# Patient Record
Sex: Female | Born: 1975 | Race: White | Hispanic: No | Marital: Married | Smoking: Never smoker
Health system: Southern US, Community
[De-identification: ages and names within clinical notes are randomized; demographics above are authoritative.]

## PROBLEM LIST (undated history)

## (undated) DIAGNOSIS — D649 Anemia, unspecified: Secondary | ICD-10-CM

## (undated) DIAGNOSIS — T7840XA Allergy, unspecified, initial encounter: Secondary | ICD-10-CM

## (undated) DIAGNOSIS — Z01419 Encounter for gynecological examination (general) (routine) without abnormal findings: Secondary | ICD-10-CM

## (undated) HISTORY — DX: Anemia, unspecified: D64.9

## (undated) HISTORY — DX: Allergy, unspecified, initial encounter: T78.40XA

## (undated) HISTORY — PX: LASER ABLATION OF THE CERVIX: SHX1949

## (undated) HISTORY — PX: MOUTH SURGERY: SHX715

## (undated) HISTORY — DX: Encounter for gynecological examination (general) (routine) without abnormal findings: Z01.419

---

## 1998-10-10 ENCOUNTER — Other Ambulatory Visit: Admission: RE | Admit: 1998-10-10 | Discharge: 1998-10-10 | Payer: Self-pay | Admitting: Obstetrics and Gynecology

## 1999-04-02 ENCOUNTER — Other Ambulatory Visit: Admission: RE | Admit: 1999-04-02 | Discharge: 1999-04-02 | Payer: Self-pay | Admitting: Obstetrics and Gynecology

## 1999-05-07 ENCOUNTER — Other Ambulatory Visit: Admission: RE | Admit: 1999-05-07 | Discharge: 1999-05-07 | Payer: Self-pay | Admitting: *Deleted

## 1999-05-07 ENCOUNTER — Encounter (INDEPENDENT_AMBULATORY_CARE_PROVIDER_SITE_OTHER): Payer: Self-pay | Admitting: Specialist

## 1999-07-24 ENCOUNTER — Encounter (INDEPENDENT_AMBULATORY_CARE_PROVIDER_SITE_OTHER): Payer: Self-pay

## 1999-07-24 ENCOUNTER — Other Ambulatory Visit: Admission: RE | Admit: 1999-07-24 | Discharge: 1999-07-24 | Payer: Self-pay | Admitting: Obstetrics and Gynecology

## 2001-01-21 ENCOUNTER — Other Ambulatory Visit: Admission: RE | Admit: 2001-01-21 | Discharge: 2001-01-21 | Payer: Self-pay | Admitting: *Deleted

## 2002-10-21 ENCOUNTER — Ambulatory Visit (HOSPITAL_COMMUNITY): Admission: RE | Admit: 2002-10-21 | Discharge: 2002-10-21 | Payer: Self-pay | Admitting: Obstetrics and Gynecology

## 2002-10-21 ENCOUNTER — Encounter (INDEPENDENT_AMBULATORY_CARE_PROVIDER_SITE_OTHER): Payer: Self-pay

## 2003-01-09 ENCOUNTER — Other Ambulatory Visit: Admission: RE | Admit: 2003-01-09 | Discharge: 2003-01-09 | Payer: Self-pay | Admitting: Obstetrics and Gynecology

## 2003-07-31 ENCOUNTER — Inpatient Hospital Stay (HOSPITAL_COMMUNITY): Admission: AD | Admit: 2003-07-31 | Discharge: 2003-07-31 | Payer: Self-pay | Admitting: Obstetrics and Gynecology

## 2003-08-01 ENCOUNTER — Inpatient Hospital Stay (HOSPITAL_COMMUNITY): Admission: AD | Admit: 2003-08-01 | Discharge: 2003-08-01 | Payer: Self-pay | Admitting: Obstetrics and Gynecology

## 2003-10-06 ENCOUNTER — Inpatient Hospital Stay (HOSPITAL_COMMUNITY): Admission: AD | Admit: 2003-10-06 | Discharge: 2003-10-09 | Payer: Self-pay | Admitting: Obstetrics and Gynecology

## 2005-04-12 ENCOUNTER — Inpatient Hospital Stay (HOSPITAL_COMMUNITY): Admission: AD | Admit: 2005-04-12 | Discharge: 2005-04-12 | Payer: Self-pay | Admitting: Obstetrics and Gynecology

## 2005-05-27 ENCOUNTER — Inpatient Hospital Stay (HOSPITAL_COMMUNITY): Admission: AD | Admit: 2005-05-27 | Discharge: 2005-05-27 | Payer: Self-pay | Admitting: Obstetrics and Gynecology

## 2005-06-03 ENCOUNTER — Inpatient Hospital Stay (HOSPITAL_COMMUNITY): Admission: AD | Admit: 2005-06-03 | Discharge: 2005-06-04 | Payer: Self-pay | Admitting: Obstetrics and Gynecology

## 2005-09-26 ENCOUNTER — Ambulatory Visit: Payer: Self-pay | Admitting: Family Medicine

## 2006-09-13 ENCOUNTER — Emergency Department (HOSPITAL_COMMUNITY): Admission: EM | Admit: 2006-09-13 | Discharge: 2006-09-13 | Payer: Self-pay | Admitting: Emergency Medicine

## 2006-10-27 ENCOUNTER — Ambulatory Visit (HOSPITAL_COMMUNITY): Admission: RE | Admit: 2006-10-27 | Discharge: 2006-10-27 | Payer: Self-pay | Admitting: Obstetrics and Gynecology

## 2006-11-10 ENCOUNTER — Ambulatory Visit (HOSPITAL_COMMUNITY): Admission: RE | Admit: 2006-11-10 | Discharge: 2006-11-10 | Payer: Self-pay | Admitting: Obstetrics and Gynecology

## 2006-11-24 ENCOUNTER — Ambulatory Visit (HOSPITAL_COMMUNITY): Admission: RE | Admit: 2006-11-24 | Discharge: 2006-11-24 | Payer: Self-pay | Admitting: Obstetrics and Gynecology

## 2006-12-08 ENCOUNTER — Ambulatory Visit (HOSPITAL_COMMUNITY): Admission: RE | Admit: 2006-12-08 | Discharge: 2006-12-08 | Payer: Self-pay | Admitting: Obstetrics and Gynecology

## 2006-12-22 ENCOUNTER — Ambulatory Visit (HOSPITAL_COMMUNITY): Admission: RE | Admit: 2006-12-22 | Discharge: 2006-12-22 | Payer: Self-pay | Admitting: Obstetrics and Gynecology

## 2007-01-06 ENCOUNTER — Ambulatory Visit (HOSPITAL_COMMUNITY): Admission: RE | Admit: 2007-01-06 | Discharge: 2007-01-06 | Payer: Self-pay | Admitting: Obstetrics and Gynecology

## 2007-01-21 ENCOUNTER — Ambulatory Visit (HOSPITAL_COMMUNITY): Admission: RE | Admit: 2007-01-21 | Discharge: 2007-01-21 | Payer: Self-pay | Admitting: Obstetrics and Gynecology

## 2007-02-10 ENCOUNTER — Ambulatory Visit (HOSPITAL_COMMUNITY): Admission: RE | Admit: 2007-02-10 | Discharge: 2007-02-10 | Payer: Self-pay | Admitting: Obstetrics and Gynecology

## 2007-02-17 ENCOUNTER — Ambulatory Visit: Payer: Self-pay | Admitting: Family Medicine

## 2007-02-24 ENCOUNTER — Ambulatory Visit (HOSPITAL_COMMUNITY): Admission: RE | Admit: 2007-02-24 | Discharge: 2007-02-24 | Payer: Self-pay | Admitting: Obstetrics and Gynecology

## 2007-03-12 ENCOUNTER — Ambulatory Visit (HOSPITAL_COMMUNITY): Admission: RE | Admit: 2007-03-12 | Discharge: 2007-03-12 | Payer: Self-pay | Admitting: Obstetrics and Gynecology

## 2007-03-24 ENCOUNTER — Inpatient Hospital Stay (HOSPITAL_COMMUNITY): Admission: AD | Admit: 2007-03-24 | Discharge: 2007-03-26 | Payer: Self-pay | Admitting: Obstetrics and Gynecology

## 2007-07-02 ENCOUNTER — Ambulatory Visit: Payer: Self-pay | Admitting: Family Medicine

## 2007-07-02 DIAGNOSIS — J019 Acute sinusitis, unspecified: Secondary | ICD-10-CM | POA: Insufficient documentation

## 2007-09-16 HISTORY — PX: ENDOMETRIAL ABLATION W/ NOVASURE: SUR434

## 2007-11-24 ENCOUNTER — Ambulatory Visit: Payer: Self-pay | Admitting: Family Medicine

## 2007-11-24 DIAGNOSIS — IMO0001 Reserved for inherently not codable concepts without codable children: Secondary | ICD-10-CM | POA: Insufficient documentation

## 2007-12-13 ENCOUNTER — Telehealth: Payer: Self-pay | Admitting: Family Medicine

## 2007-12-17 ENCOUNTER — Encounter: Payer: Self-pay | Admitting: Family Medicine

## 2008-01-08 IMAGING — US US OB MCA DOPPLER
1 series · 14 of 18 positions shown · non-contrast
Comparison: none

OBSTETRICAL ULTRASOUND:
 This ultrasound was performed in The [HOSPITAL], and the [facility] report will be stored to [REDACTED] PACS.

[Series 1: us ob mca doppler · 14 of 18 slices shown]
[im 1/18]
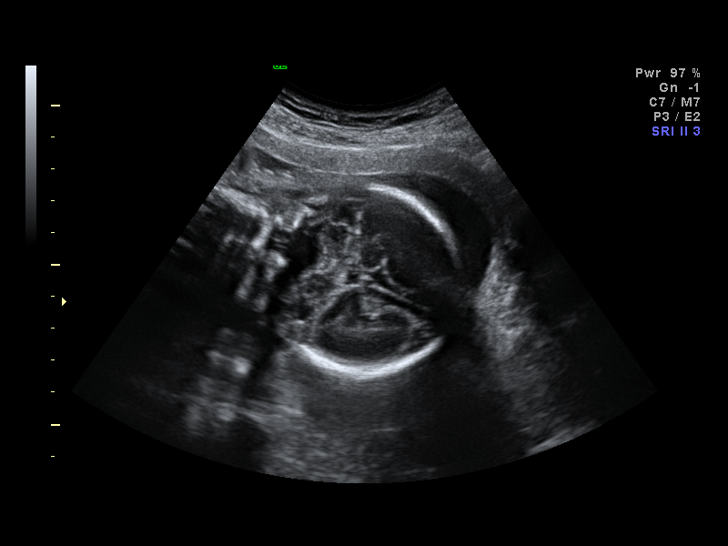
[im 2/18]
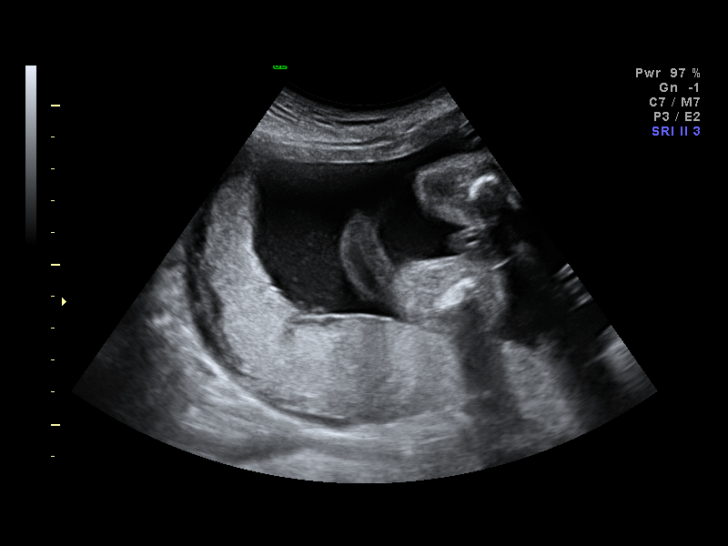
[im 4/18]
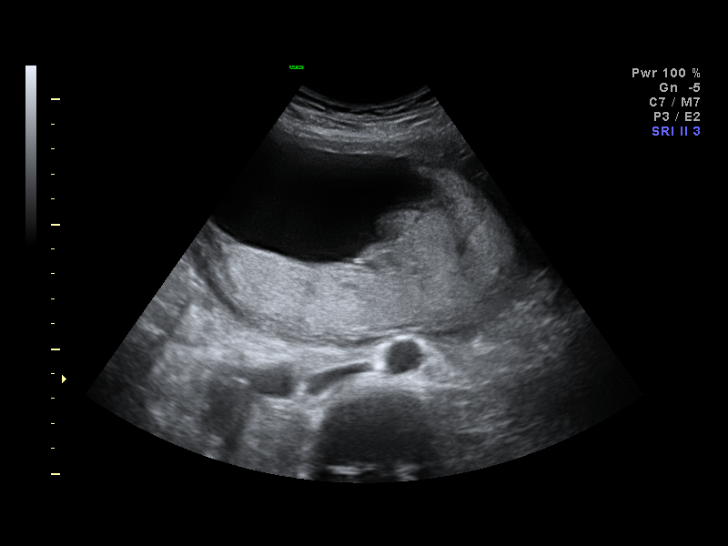
[im 5/18]
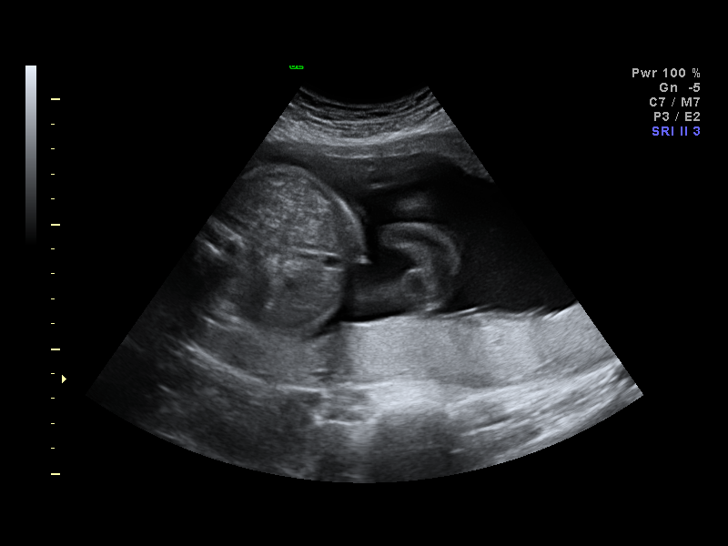
[im 6/18]
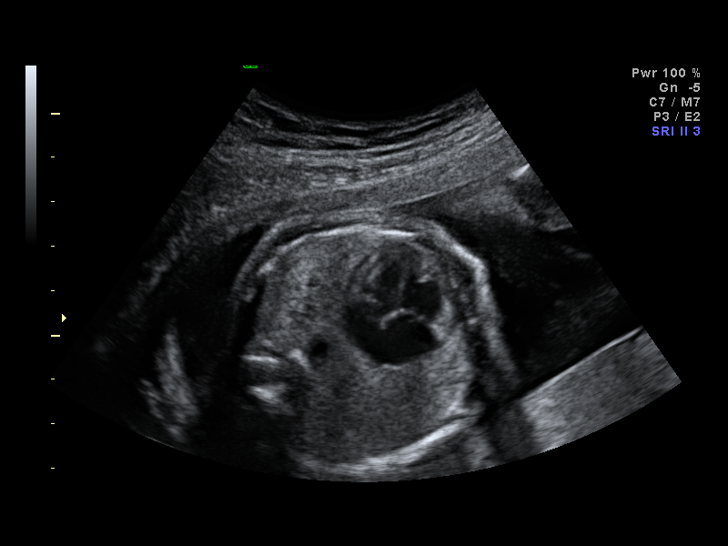
[im 8/18]
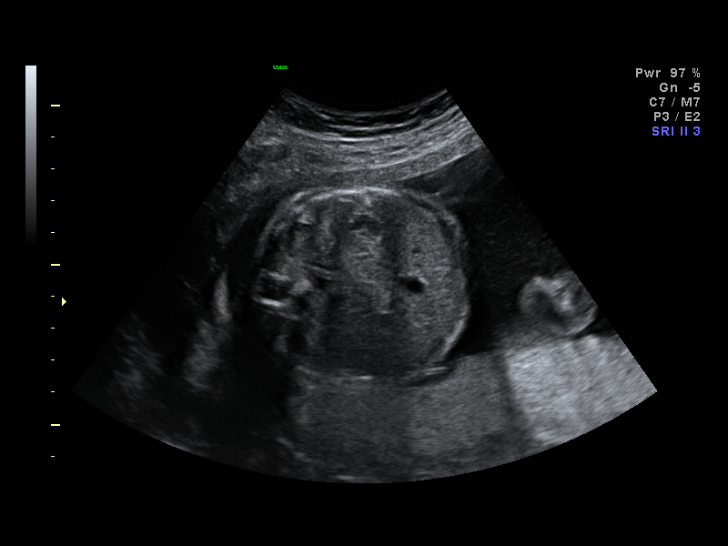
[im 9/18]
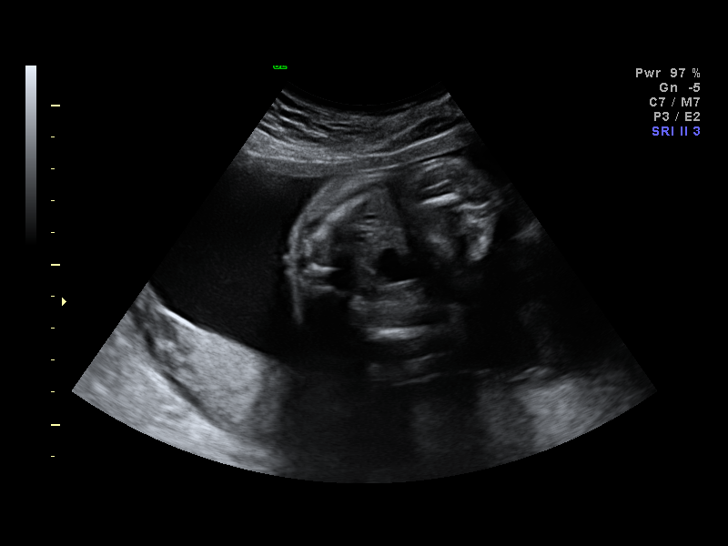
[im 10/18]
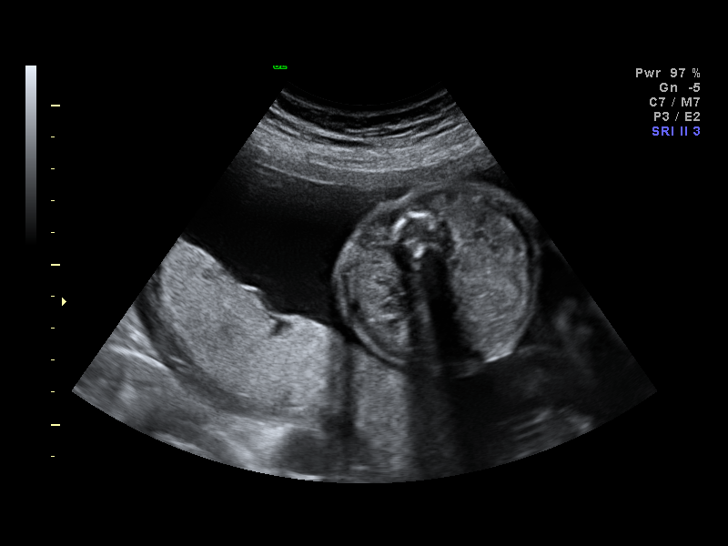
[im 11/18]
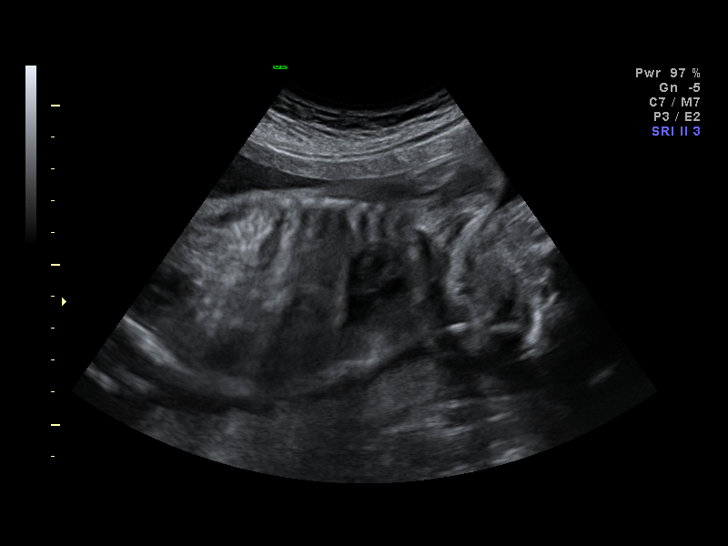
[im 13/18]
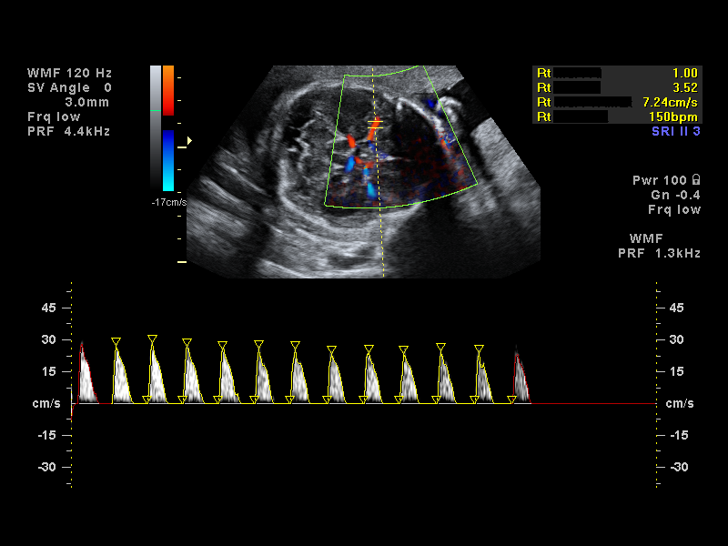
[im 14/18]
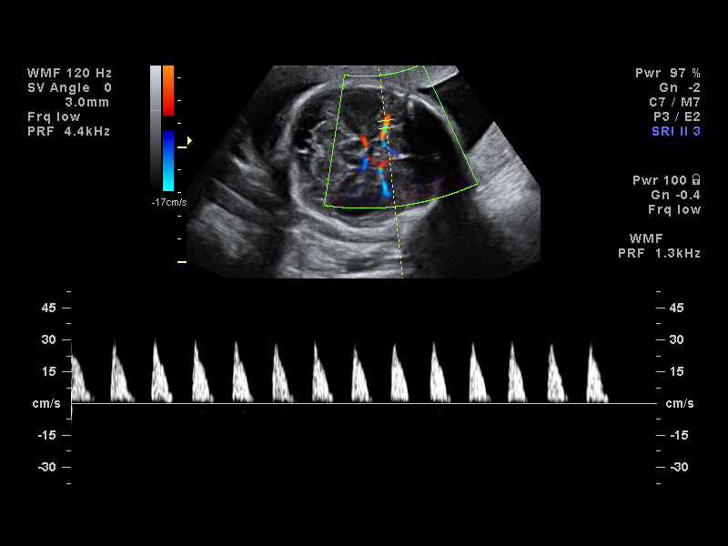
[im 15/18]
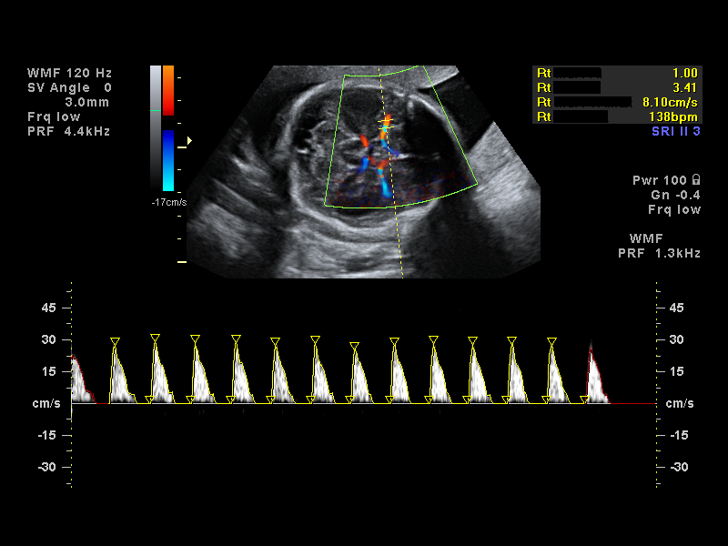
[im 17/18]
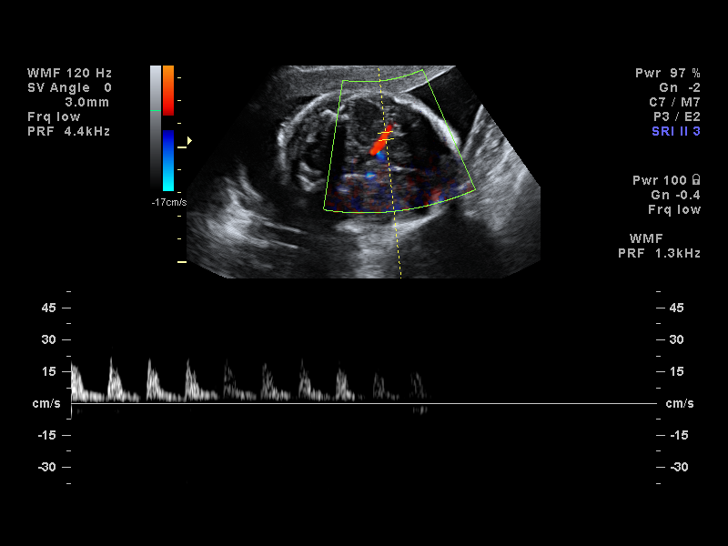
[im 18/18]
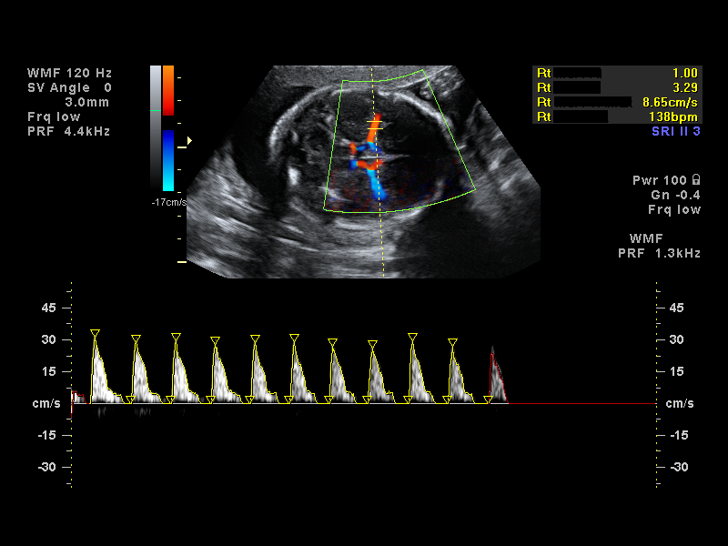

[14 of 18 positions shown; findings below may reference images not displayed]

IMPRESSION: The [facility] report has also been faxed to the ordering physician.

## 2008-01-31 ENCOUNTER — Ambulatory Visit: Payer: Self-pay | Admitting: Family Medicine

## 2008-01-31 DIAGNOSIS — S91309A Unspecified open wound, unspecified foot, initial encounter: Secondary | ICD-10-CM

## 2008-02-07 IMAGING — US US OB FOLLOW-UP
1 series · 14 of 28 positions shown · non-contrast
Comparison: none

OBSTETRICAL ULTRASOUND:
 This ultrasound was performed in The [HOSPITAL], and the [facility] report will be stored to [REDACTED] PACS.

[Series 1: us ob follow-up · 14 of 33 slices shown]
[im 2/33]
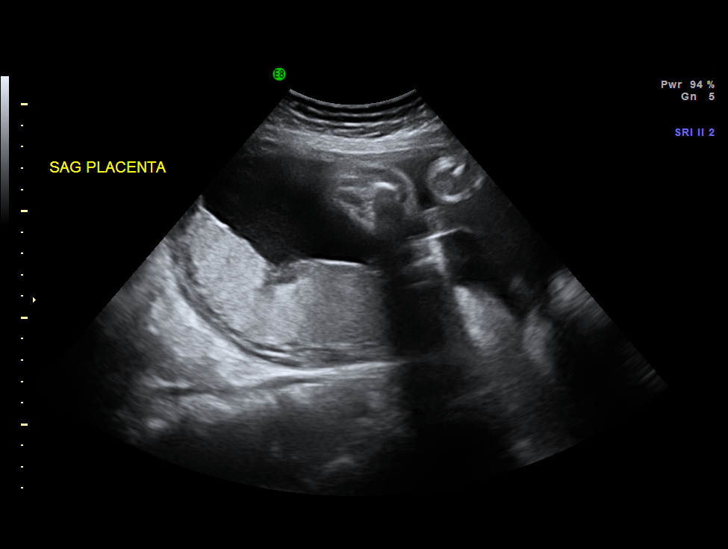
[im 4/33]
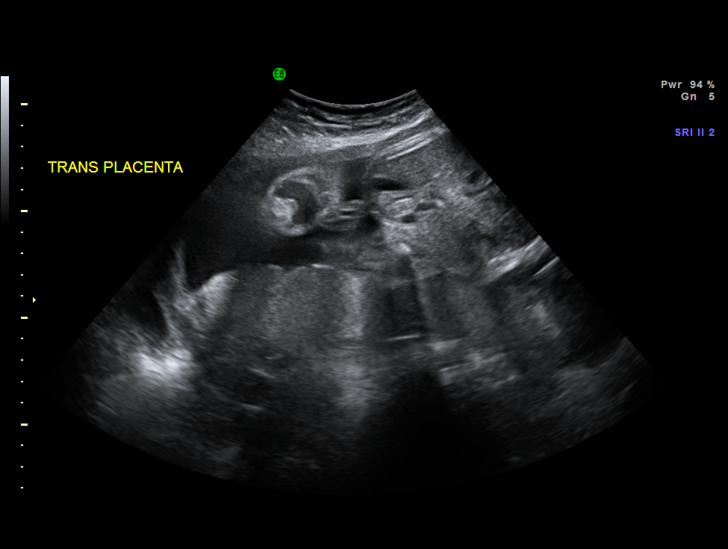
[im 6/33]
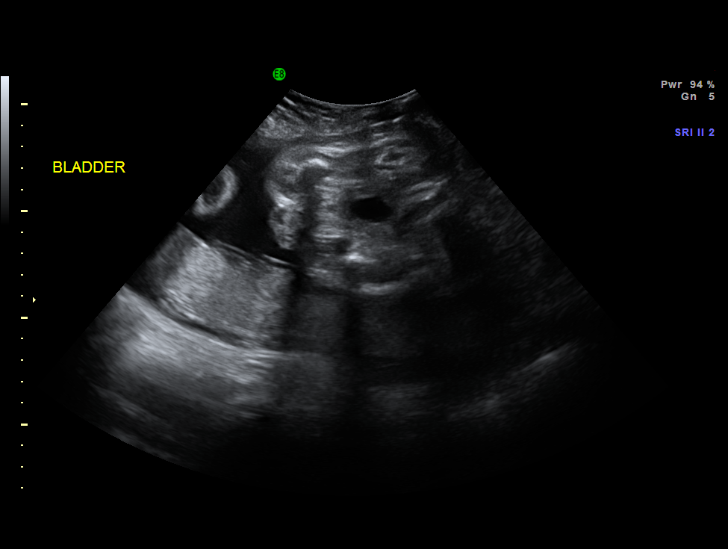
[im 9/33]
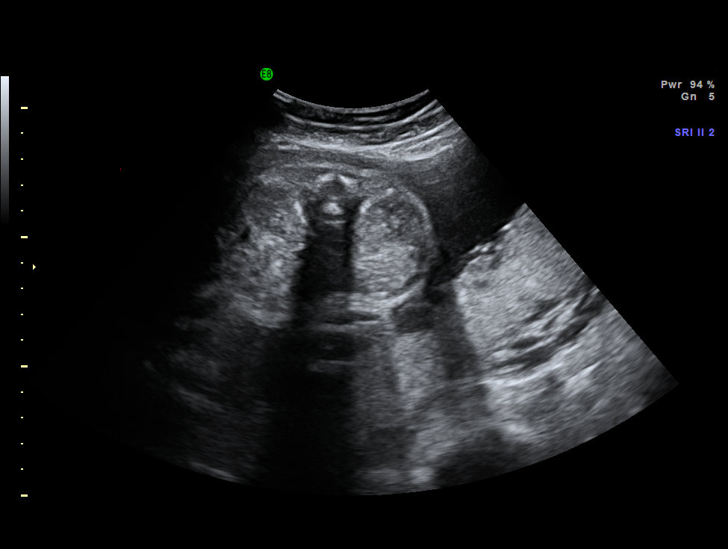
[im 11/33]
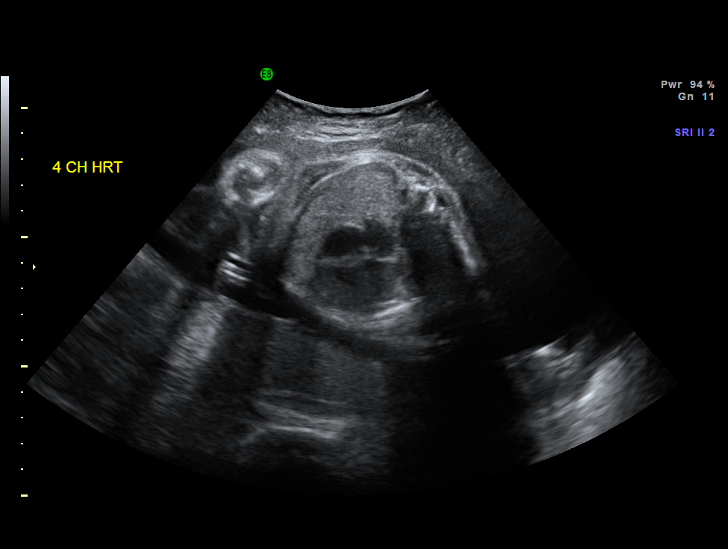
[im 14/33]
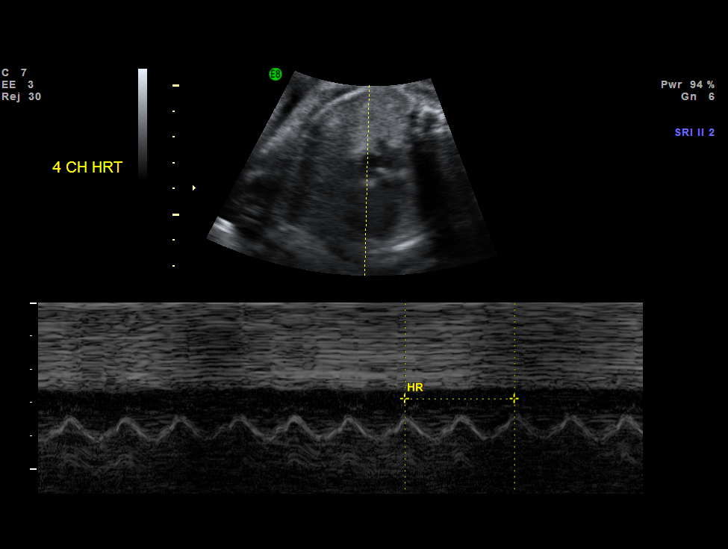
[im 16/33]
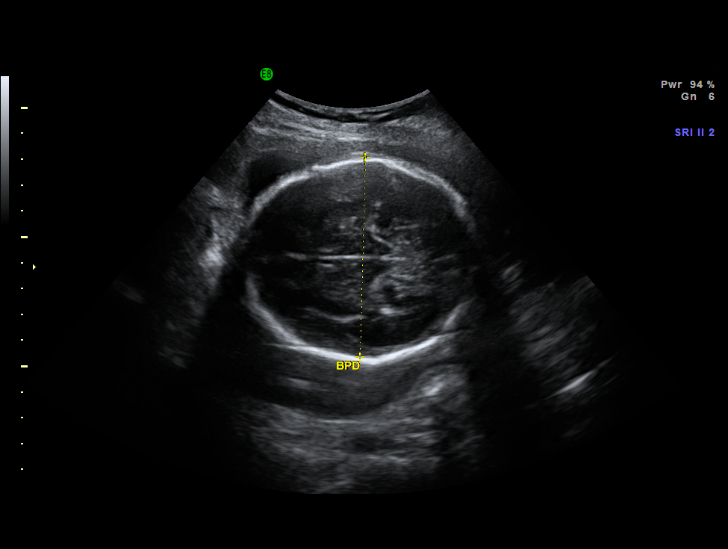
[im 18/33]
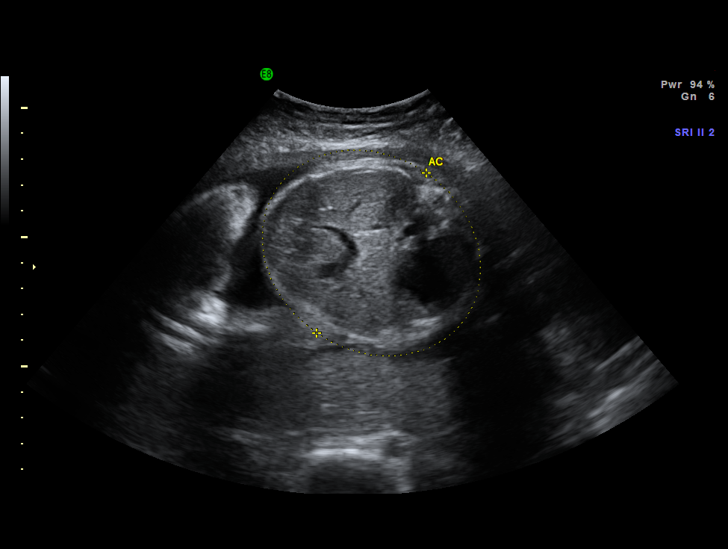
[im 21/33]
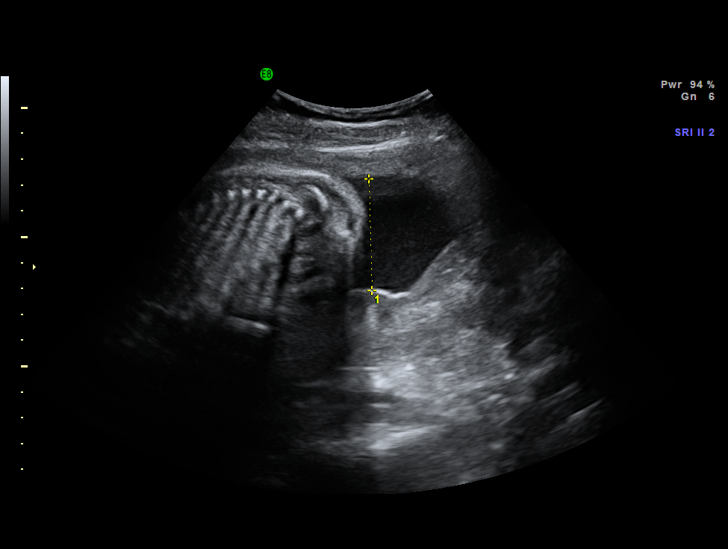
[im 23/33]
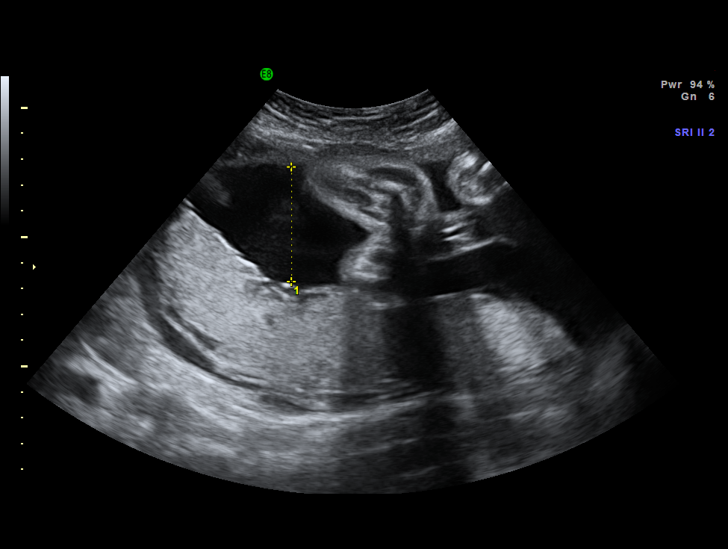
[im 25/33]
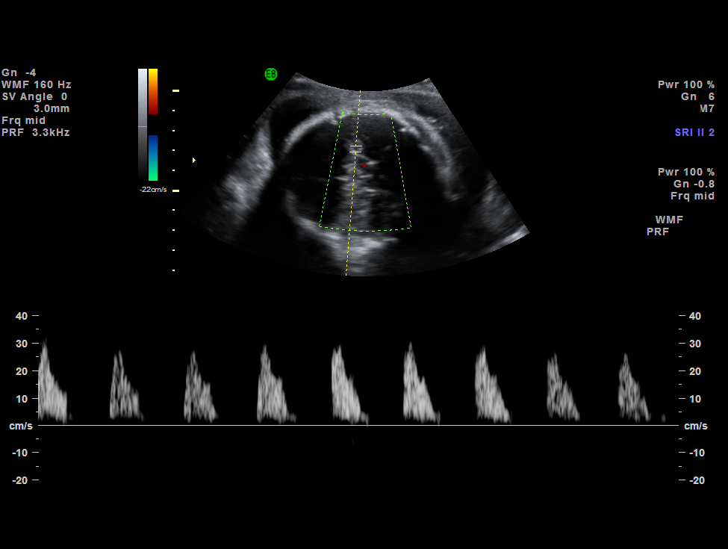
[im 28/33]
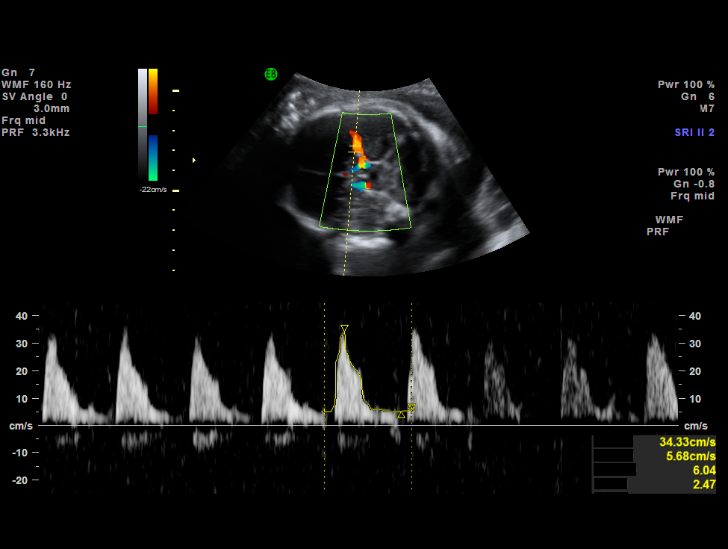
[im 30/33]
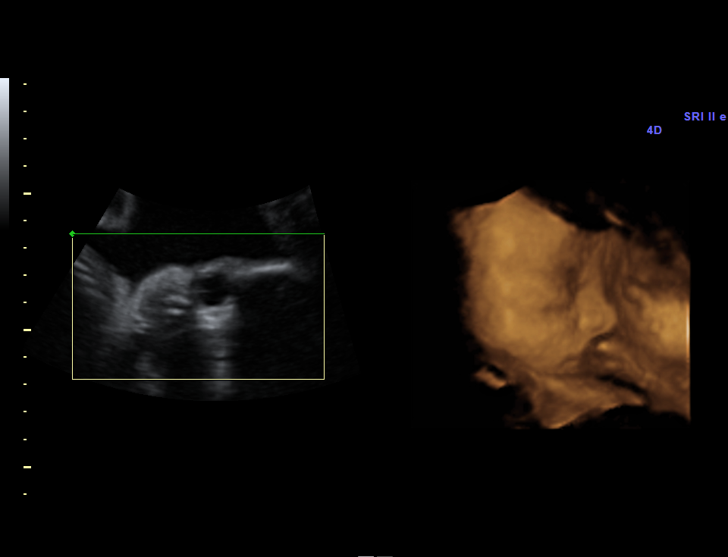
[im 33/33]
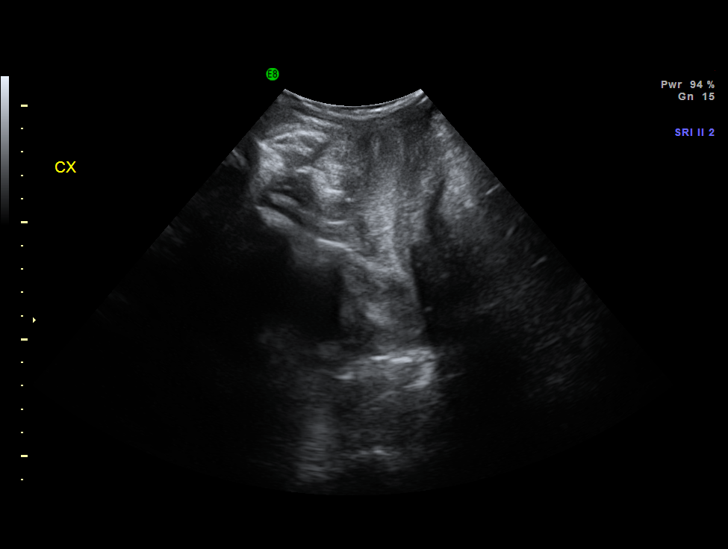

[14 of 28 positions shown; findings below may reference images not displayed]

IMPRESSION: The [facility] report has also been faxed to the ordering physician.

## 2008-06-30 ENCOUNTER — Telehealth: Payer: Self-pay | Admitting: Family Medicine

## 2009-01-26 ENCOUNTER — Telehealth: Payer: Self-pay | Admitting: Family Medicine

## 2009-03-21 ENCOUNTER — Telehealth (INDEPENDENT_AMBULATORY_CARE_PROVIDER_SITE_OTHER): Payer: Self-pay | Admitting: *Deleted

## 2009-05-11 ENCOUNTER — Ambulatory Visit: Payer: Self-pay | Admitting: Family Medicine

## 2009-05-11 LAB — CONVERTED CEMR LAB: Rapid Strep: NEGATIVE

## 2010-09-05 ENCOUNTER — Ambulatory Visit: Payer: Self-pay | Admitting: Family Medicine

## 2010-09-05 DIAGNOSIS — H9209 Otalgia, unspecified ear: Secondary | ICD-10-CM

## 2010-10-11 ENCOUNTER — Ambulatory Visit (HOSPITAL_COMMUNITY)
Admission: RE | Admit: 2010-10-11 | Discharge: 2010-10-11 | Payer: Self-pay | Source: Home / Self Care | Attending: Obstetrics and Gynecology | Admitting: Obstetrics and Gynecology

## 2010-10-11 LAB — CBC
Hemoglobin: 13.8 g/dL (ref 12.0–15.0)
WBC: 5.2 10*3/uL (ref 4.0–10.5)

## 2010-10-17 NOTE — Assessment & Plan Note (Signed)
Summary: EAR DISCOMFORT / PAIN // RS   Vital Signs:  Patient profile:   35 year old female Weight:      138 pounds Temp:     98.0 degrees F oral BP sitting:   120 / 82  (left arm) Cuff size:   regular  Vitals Entered By: Sid Falcon LPN (September 05, 2010 2:38 PM)  History of Present Illness: L ear discomfort for few days URI symptoms few weeks ago but have cleared.  No vertigo, persistent nasal drainage, fever, or sore throat. No hearing loss.  ? worse with eating.  No parotid enlargement. No facial rash.  Allergies: 1)  ! * Codiene  Past History:  Past Medical History: Last updated: 11/24/2007 unremarkable  Review of Systems      See HPI  Physical Exam  General:  Well-developed,well-nourished,in no acute distress; alert,appropriate and cooperative throughout examination Head:  Normocephalic and atraumatic without obvious abnormalities. No apparent alopecia or balding. No TMJ pain Ears:  External ear exam shows no significant lesions or deformities.  Otoscopic examination reveals clear canals, tympanic membranes are intact bilaterally without bulging, retraction, inflammation or discharge. Hearing is grossly normal bilaterally. Mouth:  Oral mucosa and oropharynx without lesions or exudates.  Teeth in good repair. Neck:  No deformities, masses, or tenderness noted. Lungs:  Normal respiratory effort, chest expands symmetrically. Lungs are clear to auscultation, no crackles or wheezes. Heart:  Normal rate and regular rhythm. S1 and S2 normal without gallop, murmur, click, rub or other extra sounds. Skin:  no rashes.   Cervical Nodes:  No lymphadenopathy noted   Impression & Recommendations:  Problem # 1:  OTOGENIC PAIN (ICD-388.71) no signs of infection. ?referred from TMJ. OTC nsaid and follow up if no better.  Complete Medication List: 1)  Multivitamins Tabs (Multiple vitamin) .... Once daily   Orders Added: 1)  Est. Patient Level III [84132]

## 2010-10-23 NOTE — Op Note (Signed)
  Alicia Campos, Alicia Campos              ACCOUNT NO.:  [id number]  MEDICAL RECORD NO.:  [id number]          PATIENT TYPE:  AMB  LOCATION:  [facility]C                           FACILITY:  [facility]  PHYSICIAN:  Maxie Better, M.D.DATE OF BIRTH:  1976/06/15  DATE OF PROCEDURE:  10/11/2010 DATE OF DISCHARGE:  10/11/2010                              OPERATIVE REPORT   PREOPERATIVE DIAGNOSES:  Dysfunctional uterine bleeding/menorrhagia.  PROCEDURES:  Diagnostic hysteroscopy and NovaSure endometrial ablation.  POSTOPERATIVE DIAGNOSES:  Dysfunctional uterine bleeding/menorrhagia.  ANESTHESIA:  General, paracervical block.  SURGEON:  Maxie Better, MD  ASSISTANT:  None.  PROCEDURE:  Under general anesthesia, the patient was placed in the dorsal lithotomy position.  She was sterilely prepped and draped in the usual fashion.  The bladder was catheterized with small amount of urine. Examination under anesthesia revealed a retroverted uterus, small and no adnexal masses could be appreciated.  A bivalve speculum was placed in the vagina and 20 mL of 1% Nesacaine was injected at a 3 and 9 o'clock position.  The anterior lip of the cervix was grasped with a single- tooth tenaculum.  The uterus sounded to 7-cm.  The endocervical canal sounded to 3-cm and the cervix was then serially dilated and a diagnostic hysteroscope was introduced into the uterine cavity without incident.  Both tubal ostia's could be seen.  No lesions were noted. The hysteroscope was removed.  The cervix was further dilated and the NovaSure endometrial apparatus was inserted.  The cavity width was noted to be 3.2 and the procedure was started with power of 53 and ablation time of 1 minute and 15 seconds.  The instrument was then removed.  The hysteroscope was then reinserted.  Good endometrial ablation was noted throughout.  The procedure was then terminated by removing all instruments from the vagina.  SPECIMENS:   None.  ESTIMATED BLOOD LOSS:  Minimal.  COMPLICATION:  None.  The patient tolerated the procedure well.  FLUID DEFICIT:  30 mL.  The patient was transferred to recovery room in stable condition.     Maxie Better, M.D.     [place]/MEDQ  D:  10/11/2010  T:  10/12/2010  Job:  [id number]  Electronically Signed by Nena Jordan Jiali Linney M.D. on 10/23/2010 07:56:05 AM

## 2010-10-28 ENCOUNTER — Ambulatory Visit: Payer: Self-pay | Admitting: Family Medicine

## 2011-01-31 NOTE — Assessment & Plan Note (Signed)
[facility]                                 ON-CALL NOTE   WINONA, SISON                     MRN:          [id number]  DATE:03/21/2009                            DOB:          12-23-1975    PHONE NUMBER:  [phone].   Dr. Amador Cunas is the regular doctor.   The patient said chief complaint is sore throat.  The patient said she  put a call in this morning to the office and has not heard back yet.  Her whole family has had strep throat and now she is developing symptoms  as well.  She has a sore throat, feels poorly, a low-grade fever.  Two  of her children have strep as well as her husband.  She has been heavily  exposed.  She has no drug allergies and wanted to get an antibiotic  called in.  Given her recent exposure, I agreed to go ahead and call in  amoxicillin to [facility] to [phone], amoxicillin 500 mg 1 p.o.  t.i.d. x10 days #30 with no refills.  I advised her that if her symptoms  worsen or do not improve in several days to follow up the regular  doctor.     Marne A. Tower, MD  Electronically Signed    MAT/MedQ  DD: 03/21/2009  DT: 03/22/2009  Job #: [id number]

## 2011-01-31 NOTE — Op Note (Signed)
   NAMECAMAY, Alicia Campos                          ACCOUNT NO.:  [id number]   MEDICAL RECORD NO.:  [id number]                   PATIENT TYPE:  AMB   LOCATION:  SDC                                  [facility]ACILITY:  [facility]   PHYSICIAN:  Maxie Better, M.D.            DATE OF BIRTH:  01-Jul-1976   DATE OF PROCEDURE:  10/21/2002  DATE OF DISCHARGE:                                 OPERATIVE REPORT   PREOPERATIVE DIAGNOSIS:  Blighted ovum.   PROCEDURE:  Suction dilation and evacuation.   POSTOPERATIVE DIAGNOSIS:  Blighted ovum.   ANESTHESIA:  MAC, paracervical block.   SURGEON:  Sheronette A. Cherly Hensen, M.D.   INDICATIONS FOR PROCEDURE:  This is a 35 year old gravida 1, para 0 female  with history of a LEEP procedure in the past who is here for surgical  management of a blighted ovum diagnosed by ultrasound on October 20, 2002,  at 7 and 2/7 weeks'.  Risks and benefits of the procedure have been  explained to the patient.  Her blood type is A-positive.  The patient was  transferred to the operating room.   PROCEDURE:  Under adequate monitored anesthesia, the patient was placed in  the dorsal lithotomy position.  Examination under anesthesia revealed an  anteflexed six weeks' size uterus without any adnexal masses.  The patient  was sterilely prepped and draped in the usual fashion.  The bladder was  catheterized for a small amount of urine.  A bivalve speculum was placed in  the vagina.  Nesacaine 1% 20 mL was injected paracervically at three and  nine o'clock.  The cervical os was irregular secondary to the previous LEEP  procedure.  The anterior lip of the cervix was grasped with a single-tooth  tenaculum.  The cervix was easily dilated up to 27 Pratt dilator.  An 8-mm  curved suction cannula was introduced into the uterine cavity without  incident.  Moderate amount of products of conception was removed.  The  cavity was then curetted, resuctioned.  When all tissue was felt to have  been  removed, all instruments were then removed from the vagina.  Specimen  labeled products of conception was sent to pathology.  Estimated blood  loss was minimal.  Complications were none.  The patient tolerated the  procedure well, was transferred to the recovery room in stable condition.                                               Maxie Better, M.D.    [place]/MEDQ  D:  10/21/2002  T:  10/21/2002  Job:  [id number]

## 2011-06-04 ENCOUNTER — Telehealth: Payer: Self-pay | Admitting: Family Medicine

## 2011-06-04 MED ORDER — CEPHALEXIN 500 MG PO CAPS: 500.0000 mg | ORAL_CAPSULE | Freq: Three times a day (TID) | ORAL | Status: AC

## 2011-06-04 NOTE — Telephone Encounter (Signed)
Pt called and she has a sore throat, no fever, but just not feeling well. Both of her children have tested positive for strep. Dr. Clent Ridges said to call in Keflex 500 mg take 1 po tid x 10 days, # 30 and 0 refills. I called in script and pt aware.

## 2011-07-01 LAB — CBC
HCT: 37.8
MCHC: 33.7
Platelets: 123 — ABNORMAL LOW
WBC: 11.9 — ABNORMAL HIGH
WBC: 8.4

## 2013-05-18 ENCOUNTER — Ambulatory Visit: Payer: Self-pay | Admitting: Family Medicine

## 2013-05-18 ENCOUNTER — Encounter: Payer: Self-pay | Admitting: Family Medicine

## 2013-05-18 ENCOUNTER — Ambulatory Visit (INDEPENDENT_AMBULATORY_CARE_PROVIDER_SITE_OTHER): Payer: BC Managed Care – PPO | Admitting: Family Medicine

## 2013-05-18 VITALS — BP 122/76 | HR 84 | Temp 98.4°F | Wt 142.0 lb

## 2013-05-18 DIAGNOSIS — J209 Acute bronchitis, unspecified: Secondary | ICD-10-CM

## 2013-05-18 MED ORDER — AZITHROMYCIN 250 MG PO TABS: ORAL_TABLET | ORAL | Status: DC

## 2013-05-18 NOTE — Progress Notes (Signed)
  Subjective:    Patient ID: Alicia Campos, female    DOB: Apr 09, 1976, 37 y.o.   MRN: [id number]  HPI Here for 3 weeks of a tickle in the throat and a dry cough. No sinus pressure or PND, no ST, no fever. Using Robitussin. No wheezing. She denies any GERD symptoms.    Review of Systems  Constitutional: Negative.   HENT: Negative.   Eyes: Negative.   Respiratory: Positive for cough. Negative for choking, chest tightness, shortness of breath and wheezing.        Objective:   Physical Exam  Constitutional: She appears well-developed and well-nourished. No distress.  HENT:  Right Ear: External ear normal.  Left Ear: External ear normal.  Nose: Nose normal.  Mouth/Throat: Oropharynx is clear and moist.  Eyes: Conjunctivae are normal.  Pulmonary/Chest: Effort normal and breath sounds normal.  Lymphadenopathy:    She has no cervical adenopathy.          Assessment & Plan:  This could be an atypical bronchitis or a viral URI. Cover with a Zpack. Add fluids and Robitussin. She has a Proair inhaler at home she can use prn.

## 2013-06-22 ENCOUNTER — Telehealth: Payer: Self-pay | Admitting: Family Medicine

## 2013-06-22 NOTE — Telephone Encounter (Signed)
Pt is calling to request a prescription of Duac cream. She states that she has spoken with Dr. Clent Ridges about this RX previously, and that she uses it prior to her period. She would like this called [facility]. Please assist.

## 2013-06-22 NOTE — Telephone Encounter (Signed)
Call in Duac 1-5% gel to apply bid prn, 45 grams with 5 rf

## 2013-06-24 ENCOUNTER — Other Ambulatory Visit: Payer: Self-pay | Admitting: Family Medicine

## 2013-06-24 MED ORDER — CLINDAMYCIN PHOS-BENZOYL PEROX 1.2-5 % EX GEL: CUTANEOUS | Status: DC

## 2013-06-24 NOTE — Telephone Encounter (Signed)
Sent to the pharmacy by e-scribe. 

## 2013-08-23 ENCOUNTER — Emergency Department (HOSPITAL_COMMUNITY)
Admission: EM | Admit: 2013-08-23 | Discharge: 2013-08-23 | Disposition: A | Discharge status: Home / Self Care | Payer: BC Managed Care – PPO | Attending: Emergency Medicine | Admitting: Emergency Medicine

## 2013-08-23 ENCOUNTER — Encounter (HOSPITAL_COMMUNITY): Payer: Self-pay | Admitting: Emergency Medicine

## 2013-08-23 ENCOUNTER — Emergency Department (HOSPITAL_COMMUNITY): Payer: BC Managed Care – PPO

## 2013-08-23 DIAGNOSIS — Z9889 Other specified postprocedural states: Secondary | ICD-10-CM | POA: Insufficient documentation

## 2013-08-23 DIAGNOSIS — N83209 Unspecified ovarian cyst, unspecified side: Secondary | ICD-10-CM | POA: Insufficient documentation

## 2013-08-23 DIAGNOSIS — N949 Unspecified condition associated with female genital organs and menstrual cycle: Secondary | ICD-10-CM | POA: Insufficient documentation

## 2013-08-23 DIAGNOSIS — Z79899 Other long term (current) drug therapy: Secondary | ICD-10-CM | POA: Insufficient documentation

## 2013-08-23 DIAGNOSIS — R102 Pelvic and perineal pain: Secondary | ICD-10-CM

## 2013-08-23 LAB — BASIC METABOLIC PANEL
BUN: 13 mg/dL (ref 6–23)
CO2: 26 mEq/L (ref 19–32)
Chloride: 101 mEq/L (ref 96–112)
Creatinine, Ser: 0.68 mg/dL (ref 0.50–1.10)

## 2013-08-23 LAB — CBC WITH DIFFERENTIAL/PLATELET
HCT: 40.5 % (ref 36.0–46.0)
Hemoglobin: 14.2 g/dL (ref 12.0–15.0)
Lymphocytes Relative: 33 % (ref 12–46)
Lymphs Abs: 1.6 10*3/uL (ref 0.7–4.0)
Monocytes Absolute: 0.4 10*3/uL (ref 0.1–1.0)
Monocytes Relative: 9 % (ref 3–12)
Neutro Abs: 2.6 10*3/uL (ref 1.7–7.7)
WBC: 4.7 10*3/uL (ref 4.0–10.5)

## 2013-08-23 LAB — GC/CHLAMYDIA PROBE AMP: CT Probe RNA: NEGATIVE

## 2013-08-23 LAB — URINALYSIS, ROUTINE W REFLEX MICROSCOPIC
Hgb urine dipstick: NEGATIVE
Ketones, ur: NEGATIVE mg/dL
Protein, ur: NEGATIVE mg/dL
Urobilinogen, UA: 0.2 mg/dL (ref 0.0–1.0)

## 2013-08-23 LAB — WET PREP, GENITAL: Yeast Wet Prep HPF POC: NONE SEEN

## 2013-08-23 LAB — PREGNANCY, URINE: Preg Test, Ur: NEGATIVE

## 2013-08-23 MED ORDER — HYDROCODONE-ACETAMINOPHEN 5-325 MG PO TABS: 1.0000 | ORAL_TABLET | Freq: Four times a day (QID) | ORAL | Status: DC | PRN

## 2013-08-23 MED ORDER — SODIUM CHLORIDE 0.9 % IV SOLN
Freq: Once | INTRAVENOUS | Status: AC
  Administered 2013-08-23: 20 mL/h via INTRAVENOUS

## 2013-08-23 MED ORDER — ONDANSETRON HCL 4 MG/2ML IJ SOLN
4.0000 mg | Freq: Once | INTRAMUSCULAR | Status: AC
  Administered 2013-08-23: 4 mg via INTRAVENOUS
  Filled 2013-08-23: qty 2

## 2013-08-23 MED ORDER — IOHEXOL 300 MG/ML  SOLN
25.0000 mL | Freq: Once | INTRAMUSCULAR | Status: AC | PRN
  Administered 2013-08-23: 25 mL via ORAL

## 2013-08-23 MED ORDER — IOHEXOL 300 MG/ML  SOLN
80.0000 mL | Freq: Once | INTRAMUSCULAR | Status: AC | PRN
  Administered 2013-08-23: 80 mL via INTRAVENOUS

## 2013-08-23 MED ORDER — MORPHINE SULFATE 4 MG/ML IJ SOLN
4.0000 mg | Freq: Once | INTRAMUSCULAR | Status: AC
  Administered 2013-08-23: 4 mg via INTRAVENOUS
  Filled 2013-08-23: qty 1

## 2013-08-23 MED ORDER — IBUPROFEN 400 MG PO TABS: 400.0000 mg | ORAL_TABLET | Freq: Four times a day (QID) | ORAL | Status: DC | PRN

## 2013-08-23 NOTE — ED Notes (Signed)
Pt taken to CT.

## 2013-08-23 NOTE — ED Notes (Signed)
Contacted [place]. Tube system down for results. Pt made aware.

## 2013-08-23 NOTE — ED Notes (Signed)
Pt states that she woke up experiencing right flank pain around 0220. Pt denies N/V/diarrhea.

## 2013-08-23 NOTE — ED Notes (Signed)
Pt asking to see MD for test results. MD notified.

## 2013-08-23 NOTE — ED Notes (Signed)
Notified CT that pt done drinking contrast.

## 2013-08-23 NOTE — ED Provider Notes (Signed)
CSN: [id number]     Arrival date & time 08/23/13  4098 History   First MD Initiated Contact with Patient 08/23/13 [phone]     Chief Complaint  Patient presents with  . Abdominal Pain   (Consider location/radiation/quality/duration/timing/severity/associated sxs/prior Treatment) HPI 37 year old female presents emergency department with acute onset of right lower abdominal pain waking her from sleep around 2:30 this morning.  Patient denies any nausea, vomiting, or diarrhea.  Patient was feeling well prior to going to bed.  No fevers or chills.  Patient estimates that she has 2-3 weeks out from her next menstrual period.  Patient is status post uterine ablation and has irregular periods.  No prior history of ovarian cysts, kidney stones, or prior abdominal surgeries.  No sick contacts History reviewed. No pertinent past medical history. Past Surgical History  Procedure Laterality Date  . Laser ablation of the cervix     No family history on file. History  Substance Use Topics  . Smoking status: Never Smoker   . Smokeless tobacco: Never Used  . Alcohol Use: Yes     Comment: occ   OB History   Grav Para Term Preterm Abortions TAB SAB Ect Mult Living                 Review of Systems  See History of Present Illness; otherwise all other systems are reviewed and negative  Allergies  Codeine  Home Medications   Current Outpatient Rx  Name  Route  Sig  Dispense  Refill  . FLUoxetine (PROZAC) 20 MG capsule   Oral   Take 1 capsule by mouth daily.         . Multiple Vitamin (MULTIVITAMIN WITH MINERALS) TABS tablet   Oral   Take 1 tablet by mouth daily.          BP 108/60  Pulse 74  Temp(Src) 98.2 F (36.8 C) (Oral)  Resp 20  SpO2 100%  LMP 08/09/2013 Physical Exam  Nursing note and vitals reviewed. Constitutional: She is oriented to person, place, and time. She appears well-developed and well-nourished.  HENT:  Head: Normocephalic and atraumatic.  Nose: Nose normal.   Mouth/Throat: Oropharynx is clear and moist.  Eyes: Conjunctivae and EOM are normal. Pupils are equal, round, and reactive to light.  Neck: Normal range of motion. Neck supple. No JVD present. No tracheal deviation present. No thyromegaly present.  Cardiovascular: Normal rate, regular rhythm, normal heart sounds and intact distal pulses.  Exam reveals no gallop and no friction rub.   No murmur heard. Pulmonary/Chest: Effort normal and breath sounds normal. No stridor. No respiratory distress. She has no wheezes. She has no rales. She exhibits no tenderness.  Abdominal: Soft. Bowel sounds are normal. She exhibits no distension and no mass. There is tenderness (tender to palpation in right lower quadrant.  There is no referred pain, no pain with shaking the pelvis.  Patient is more comfortable with right hip flexed). There is no rebound and no guarding.  Genitourinary:  External genitalia normal Vagina withdischarge Cervix closed no lesions No cervical motion tenderness Adnexa palpated, no masses or tenderness noted Bladder palpated no tenderness Uterus palpated no masses or tenderness    Musculoskeletal: Normal range of motion. She exhibits no edema and no tenderness.  Lymphadenopathy:    She has no cervical adenopathy.  Neurological: She is alert and oriented to person, place, and time. She has normal reflexes. No cranial nerve deficit. She exhibits normal muscle tone. Coordination normal.  Skin:  Skin is warm and dry. No rash noted. No erythema. No pallor.  Psychiatric: She has a normal mood and affect. Her behavior is normal. Judgment and thought content normal.    ED Course  Procedures (including critical care time) Labs Review Labs Reviewed  WET PREP, GENITAL - Abnormal; Notable for the following:    Clue Cells Wet Prep HPF POC FEW (*)    WBC, Wet Prep HPF POC MANY (*)    All other components within normal limits  GC/CHLAMYDIA PROBE AMP  CBC WITH DIFFERENTIAL  BASIC METABOLIC  PANEL  URINALYSIS, ROUTINE W REFLEX MICROSCOPIC  PREGNANCY, URINE   Imaging Review Ct Abdomen Pelvis W Contrast  08/23/2013   CLINICAL DATA:  Right lower quadrant and pelvic discomfort.  EXAM: CT ABDOMEN AND PELVIS WITH CONTRAST  TECHNIQUE: Multidetector CT imaging of the abdomen and pelvis was performed using the standard protocol following bolus administration of intravenous contrast.  CONTRAST:  80mL OMNIPAQUE IOHEXOL 300 MG/ML SOLN. The patient also received oral contrast material.  COMPARISON:  None.  FINDINGS: In the right aspect of the pelvis on images 60 through 28 of series 2 there is a normal calibered, uninflamed appearing partially gas-filled appendix. The partially contrast filled loops of small and large bowel exhibit no evidence of ileus nor of obstruction. There are no findings to suggest enteritis or colitis. The orally administered contrast has only reached the cecum.  The liver exhibits no focal mass nor ductal dilation. The gallbladder is adequately distended with no evidence of stones or surrounding inflammatory change. The pancreas, spleen, nondistended stomach, adrenal glands, and kidneys exhibit no acute abnormalities. The perinephric fat is normal in density. The psoas musculature also appears normal. The caliber of the abdominal aorta is normal and there is no periaortic or pericaval lymphadenopathy.  Within the pelvis the urinary bladder is moderately distended. There is a cystic right adnexal process measuring approximately 2.6 cm in diameter. There is no free fluid in the adnexal regions nor in the cul de sac. The uterus and low left adnexal structures appear normal. There is no inguinal nor significant umbilical hernia. The lung bases are clear. The lumbar vertebral bodies are preserved in height.  IMPRESSION: 1. A normal-appearing appendix is demonstrated. There are no inflammatory changes associated with the bowel. 2. There is a cystic right adnexal process. Given the patient's  symptoms recent rupture of an ovarian cyst could be present. Fine detail of the adnexal structures is limited given the lack of significant oral contrast in the rectosigmoid region. Follow-up pelvic ultrasound would be useful. 3. There is no evidence of acute hepatobiliary abnormality nor acute urinary tract abnormality.   Electronically Signed   By: David  [place]   On: 08/23/2013 07:29    EKG Interpretation   None       MDM  No diagnosis found. 37 year old female with acute onset of right lower quadrant pain.  Differential includes ovarian portion, ovarian cyst, appendicitis, among others.  Plan for pelvic exam to determine if adnexal versus intestinal source.  Based on pelvic exam, we'll either order CT scan, or ultrasound.  7:41 AM Pelvic exam showed no adnexal tenderness.  CT scan shows adnexal mass.  Will order tv US with doppler study.  Care passed to Dr Rhunette Croft.  Pt is currently pain free.     Olivia Mackie, MD 08/23/13 [phone]

## 2014-09-28 ENCOUNTER — Other Ambulatory Visit: Payer: Self-pay | Admitting: Family Medicine

## 2014-10-31 ENCOUNTER — Other Ambulatory Visit: Payer: Self-pay | Admitting: Family Medicine

## 2014-10-31 NOTE — Telephone Encounter (Signed)
Call in 45 grams with 5 rf, apply bid

## 2015-11-05 ENCOUNTER — Encounter: Payer: Self-pay | Admitting: Family Medicine

## 2015-11-05 ENCOUNTER — Ambulatory Visit (INDEPENDENT_AMBULATORY_CARE_PROVIDER_SITE_OTHER): Payer: Commercial Managed Care - PPO | Admitting: Family Medicine

## 2015-11-05 VITALS — BP 112/75 | HR 71 | Temp 97.9°F | Ht 66.0 in | Wt 140.0 lb

## 2015-11-05 DIAGNOSIS — E041 Nontoxic single thyroid nodule: Secondary | ICD-10-CM

## 2015-11-05 DIAGNOSIS — R55 Syncope and collapse: Secondary | ICD-10-CM

## 2015-11-05 LAB — HEPATIC FUNCTION PANEL
ALBUMIN: 4.5 g/dL (ref 3.5–5.2)
ALT: 17 U/L (ref 0–35)
AST: 21 U/L (ref 0–37)
Alkaline Phosphatase: 34 U/L — ABNORMAL LOW (ref 39–117)
BILIRUBIN TOTAL: 0.5 mg/dL (ref 0.2–1.2)
Bilirubin, Direct: 0.1 mg/dL (ref 0.0–0.3)
Total Protein: 6.6 g/dL (ref 6.0–8.3)

## 2015-11-05 LAB — BASIC METABOLIC PANEL
BUN: 9 mg/dL (ref 6–23)
CALCIUM: 9 mg/dL (ref 8.4–10.5)
CO2: 31 mEq/L (ref 19–32)
Chloride: 102 mEq/L (ref 96–112)
Creatinine, Ser: 0.61 mg/dL (ref 0.40–1.20)
GFR: 115 mL/min (ref >60.00)
GLUCOSE: 78 mg/dL (ref 70–99)
POTASSIUM: 4.1 meq/L (ref 3.5–5.1)
Sodium: 139 mEq/L (ref 135–145)

## 2015-11-05 LAB — LIPID PANEL
CHOL/HDL RATIO: 4
CHOLESTEROL: 253 mg/dL — AB (ref 0–200)
HDL: 65.7 mg/dL (ref >39.00)
LDL CALC: 164 mg/dL — AB (ref 0–99)
NonHDL: 187.05
TRIGLYCERIDES: 115 mg/dL (ref 0.0–149.0)
VLDL: 23 mg/dL (ref 0.0–40.0)

## 2015-11-05 LAB — POC URINALSYSI DIPSTICK (AUTOMATED)
BILIRUBIN UA: NEGATIVE
Glucose, UA: NEGATIVE
Ketones, UA: NEGATIVE
NITRITE UA: NEGATIVE
PH UA: 8.5
PROTEIN UA: NEGATIVE
RBC UA: NEGATIVE
Spec Grav, UA: 1.015
UROBILINOGEN UA: 0.2

## 2015-11-05 LAB — CBC WITH DIFFERENTIAL/PLATELET
BASOS ABS: 0 10*3/uL (ref 0.0–0.1)
Basophils Relative: 0.5 % (ref 0.0–3.0)
EOS ABS: 0 10*3/uL (ref 0.0–0.7)
Eosinophils Relative: 1.3 % (ref 0.0–5.0)
HCT: 41 % (ref 36.0–46.0)
Hemoglobin: 13.9 g/dL (ref 12.0–15.0)
LYMPHS PCT: 32.8 % (ref 12.0–46.0)
Lymphs Abs: 1.2 10*3/uL (ref 0.7–4.0)
MCHC: 33.9 g/dL (ref 30.0–36.0)
MCV: 90.4 fl (ref 78.0–100.0)
MONOS PCT: 8.2 % (ref 3.0–12.0)
Monocytes Absolute: 0.3 10*3/uL (ref 0.1–1.0)
NEUTROS ABS: 2.1 10*3/uL (ref 1.4–7.7)
NEUTROS PCT: 57.2 % (ref 43.0–77.0)
PLATELETS: 205 10*3/uL (ref 150.0–400.0)
RBC: 4.54 Mil/uL (ref 3.87–5.11)
RDW: 12.8 % (ref 11.5–15.5)
WBC: 3.7 10*3/uL — AB (ref 4.0–10.5)

## 2015-11-05 LAB — VITAMIN B12: VITAMIN B 12: 471 pg/mL (ref 211–911)

## 2015-11-05 LAB — TSH: TSH: 1.04 u[IU]/mL (ref 0.35–4.50)

## 2015-11-05 NOTE — Progress Notes (Signed)
   Subjective:    Patient ID: Alicia Campos, female    DOB: [birth year], 40 y.o.   MRN: [id number]  HPI Here at the request of her GYN, Dr. Garwin Brothers, for syncopal spells. She as had these intermittently for about 8 years. She has a brief mild spell every week, and then she has a bigger spell about every 3-6 months. She has discussed these with Dr. Garwin Brothers but has not seen anyone else for them. She feels fine most of the time. He eats a healthy diet and never skips a meal. She works out and does yoga regularly. She never has symptoms while exercising. She describes the rapid onset of feeling "dizzy" and lightheaded. She denies feeling like the room is spinning but she does have a loss of equilibrium. She has enough of a warning to sit down or lie down when these spells come, and she sometimes loses consciousness for a brief period of time. Some of these have been witnessed and she says she regains consciousness in a few seconds or minutes. She denies any SOB or chest pain, no pounding in the chest or palpitations. No blurred vision or headaches. No nausea or vomiting. After these spells she will sit for about 10 minutes before she gets up to resume her activities. She then feels slightly "out of it" for an hour or so before returning to normal. No one has ever described her as clenching or shaking during these spells. She has never had a loss of bowel or bladder control. No family history of passing out spells. She takes Prozac and vitamins only, and she has been on this for several  years.    Review of Systems  Constitutional: Negative.   HENT: Negative.   Eyes: Negative.   Respiratory: Negative.   Cardiovascular: Negative.   Gastrointestinal: Negative.   Neurological: Positive for dizziness, syncope and light-headedness. Negative for tremors, seizures, facial asymmetry, speech difficulty, weakness, numbness and headaches.       Objective:   Physical Exam  Constitutional: She appears  well-developed and well-nourished. No distress.  HENT:  Head: Normocephalic and atraumatic.  Right Ear: External ear normal.  Left Ear: External ear normal.  Mouth/Throat: Oropharynx is clear and moist.  Eyes: Conjunctivae and EOM are normal. Pupils are equal, round, and reactive to light.  Neck: Neck supple. No thyromegaly present.  Cardiovascular: Normal rate, regular rhythm, normal heart sounds and intact distal pulses.   EKG normal  Pulmonary/Chest: Effort normal and breath sounds normal. No respiratory distress. She has no wheezes. She has no rales.  Musculoskeletal: She exhibits no edema.  Lymphadenopathy:    She has no cervical adenopathy.  Neurological: She is alert. No cranial nerve deficit. She exhibits normal muscle tone. Coordination normal.          Assessment & Plan:  Syncopal episodes of unclear etiology. Possible causes include arrhythmias, migraines, and seizures. We will get fasting labs today. Set up carotid dopplers and a brain MRI soon.

## 2015-11-05 NOTE — Progress Notes (Signed)
Pre visit review using our clinic review tool, if applicable. No additional management support is needed unless otherwise documented below in the visit note. 

## 2015-11-07 ENCOUNTER — Inpatient Hospital Stay (HOSPITAL_COMMUNITY): Admission: RE | Admit: 2015-11-07 | Payer: Commercial Managed Care - PPO | Source: Ambulatory Visit

## 2015-11-07 ENCOUNTER — Ambulatory Visit (HOSPITAL_COMMUNITY)
Admission: RE | Admit: 2015-11-07 | Discharge: 2015-11-07 | Disposition: A | Discharge status: Home / Self Care | Payer: Commercial Managed Care - PPO | Source: Ambulatory Visit | Attending: Family Medicine | Admitting: Family Medicine

## 2015-11-07 DIAGNOSIS — R55 Syncope and collapse: Secondary | ICD-10-CM | POA: Insufficient documentation

## 2015-11-07 DIAGNOSIS — R42 Dizziness and giddiness: Secondary | ICD-10-CM | POA: Diagnosis not present

## 2015-11-12 ENCOUNTER — Telehealth: Payer: Self-pay | Admitting: Family Medicine

## 2015-11-12 NOTE — Addendum Note (Signed)
Addended by: Alysia Penna A on: 11/12/2015 09:15 AM   Modules accepted: Orders

## 2015-11-12 NOTE — Telephone Encounter (Signed)
Pt would like a little more information on why Dr Sarajane Jews ordered another ultrasound on her thyroid . Pt has scheduled the ultrasound and MRI for tomorrow.

## 2015-11-13 ENCOUNTER — Ambulatory Visit
Admission: RE | Admit: 2015-11-13 | Discharge: 2015-11-13 | Disposition: A | Discharge status: Home / Self Care | Payer: Commercial Managed Care - PPO | Source: Ambulatory Visit | Attending: Family Medicine | Admitting: Family Medicine

## 2015-11-13 DIAGNOSIS — R55 Syncope and collapse: Secondary | ICD-10-CM

## 2015-11-13 DIAGNOSIS — E041 Nontoxic single thyroid nodule: Secondary | ICD-10-CM

## 2015-11-13 MED ORDER — GADOBENATE DIMEGLUMINE 529 MG/ML IV SOLN
13.0000 mL | Freq: Once | INTRAVENOUS | Status: AC | PRN
  Administered 2015-11-13: 13 mL via INTRAVENOUS

## 2015-11-15 ENCOUNTER — Other Ambulatory Visit: Payer: Commercial Managed Care - PPO

## 2016-02-04 ENCOUNTER — Telehealth: Payer: Self-pay | Admitting: Family Medicine

## 2016-02-04 NOTE — Telephone Encounter (Signed)
Have her make an OV to talk about this again. That way we can decide the next path to take

## 2016-02-04 NOTE — Telephone Encounter (Signed)
Spoke with pt and she will call back to make appt when she has her work schedule available.

## 2016-02-04 NOTE — Telephone Encounter (Signed)
Pt would like to know what would be the next steps to take after having the Brain scan?

## 2016-05-22 ENCOUNTER — Other Ambulatory Visit: Payer: Self-pay | Admitting: Family Medicine

## 2016-11-27 ENCOUNTER — Other Ambulatory Visit: Payer: Self-pay | Admitting: Family Medicine

## 2016-11-27 NOTE — Telephone Encounter (Signed)
Yes, call  In 45 gm with one rf

## 2016-11-27 NOTE — Telephone Encounter (Signed)
Can we refill this? 

## 2016-11-29 IMAGING — US US SOFT TISSUE HEAD/NECK
1 series · 14 of 25 positions shown · non-contrast
Comparison: None.

CLINICAL DATA: Density seen within the right lobe of the thyroid on
recent carotid Doppler ultrasound.

EXAM:
THYROID ULTRASOUND
TECHNIQUE: Ultrasound examination of the thyroid gland and adjacent soft
tissues was performed.

[Series 1: us soft tissue head/neck · 0.06mm/px · 14 of 40 slices shown]
[im 1/40]
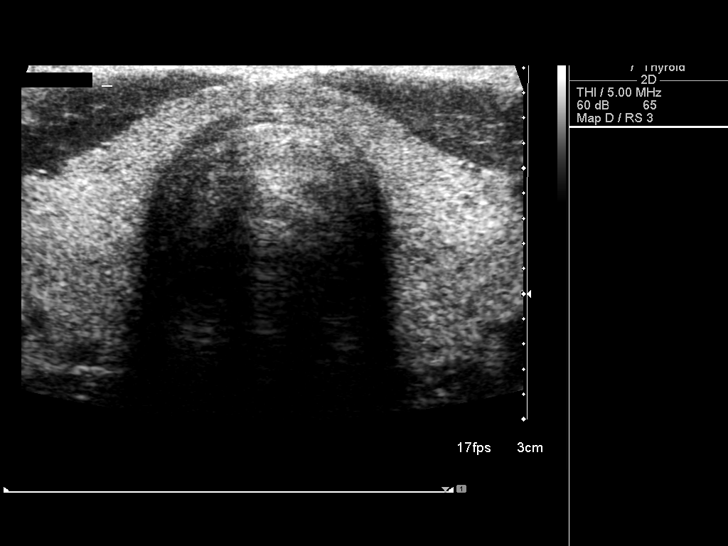
[im 4/40]
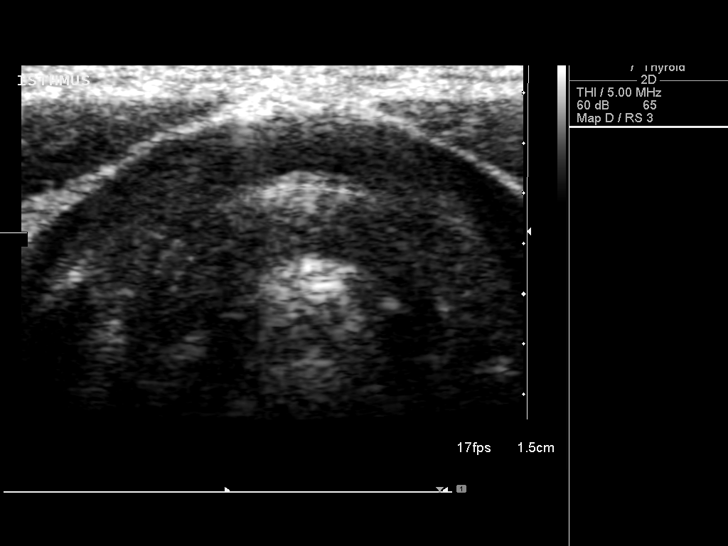
[im 7/40]
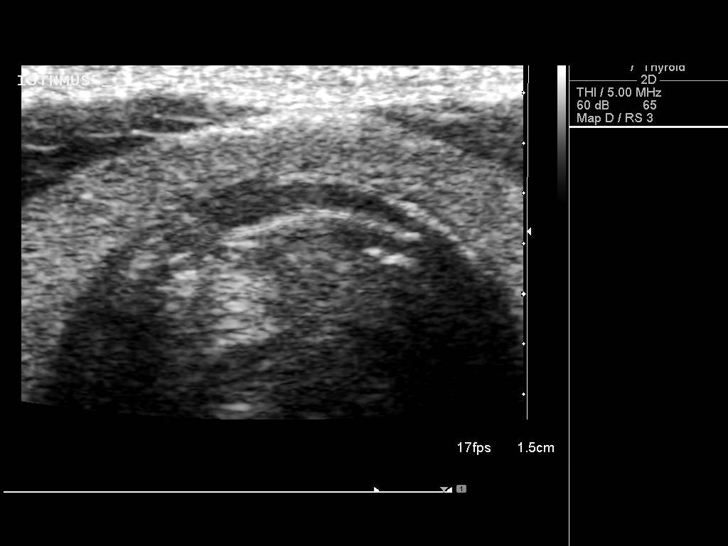
[im 10/40]
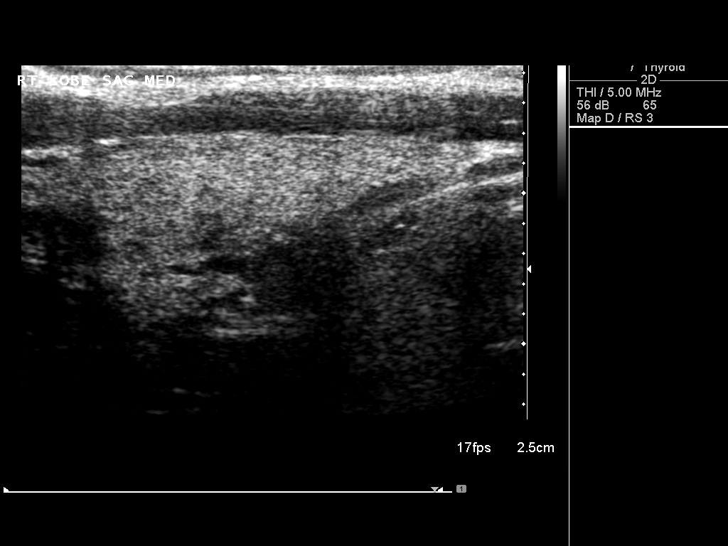
[im 14/40]
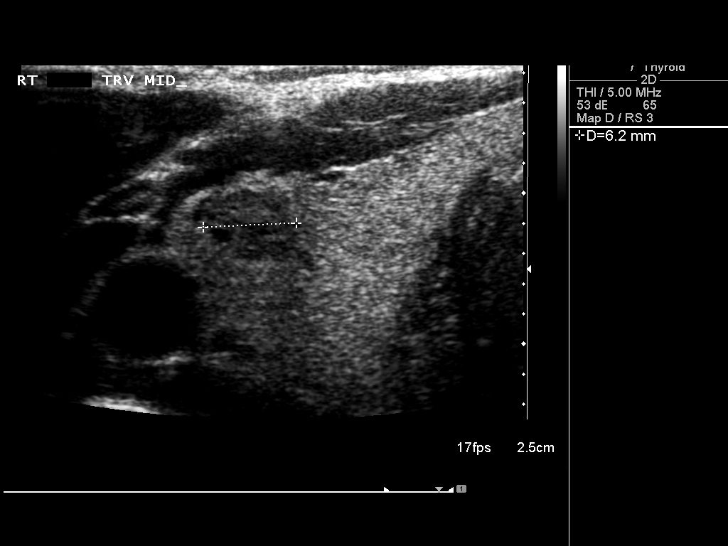
[im 15/40]
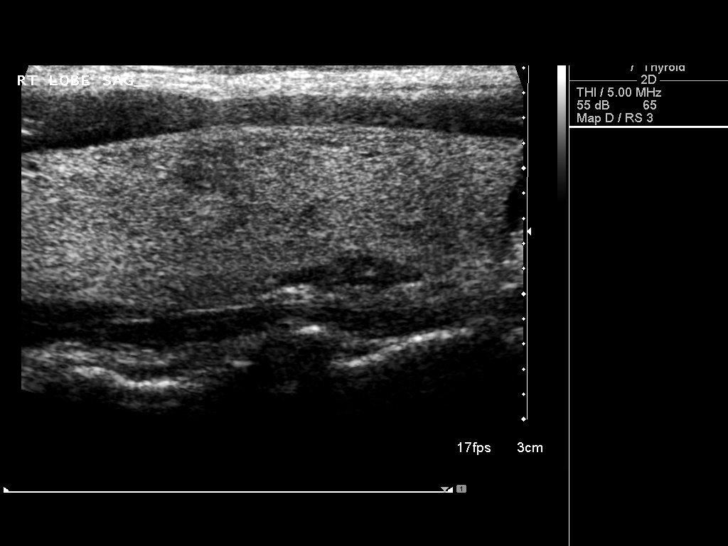
[im 18/40]
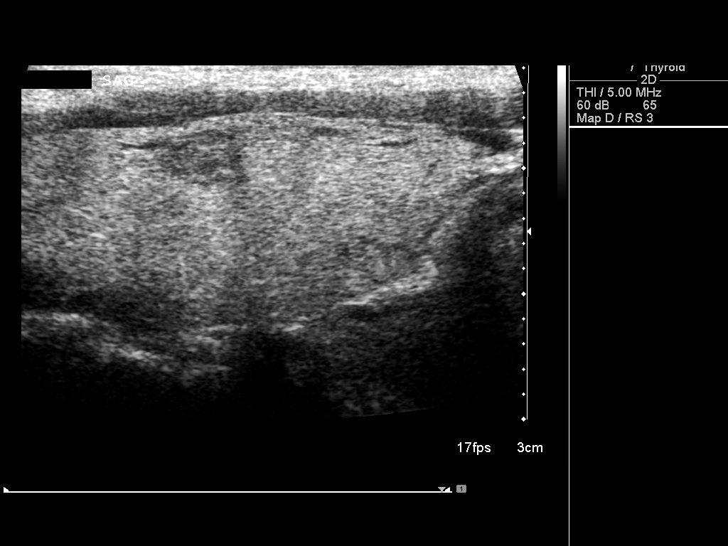
[im 22/40]
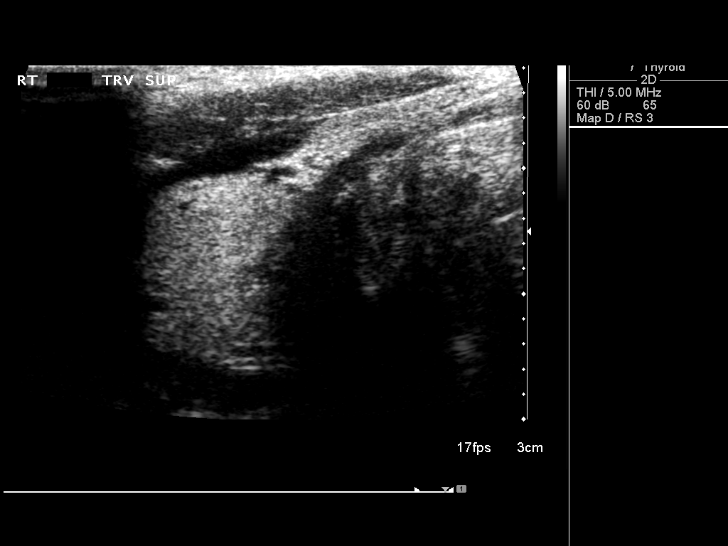
[im 25/40]
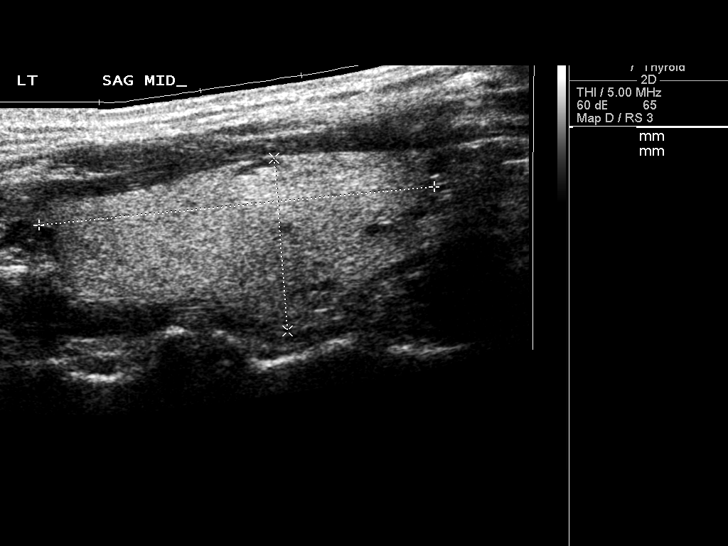
[im 27/40]
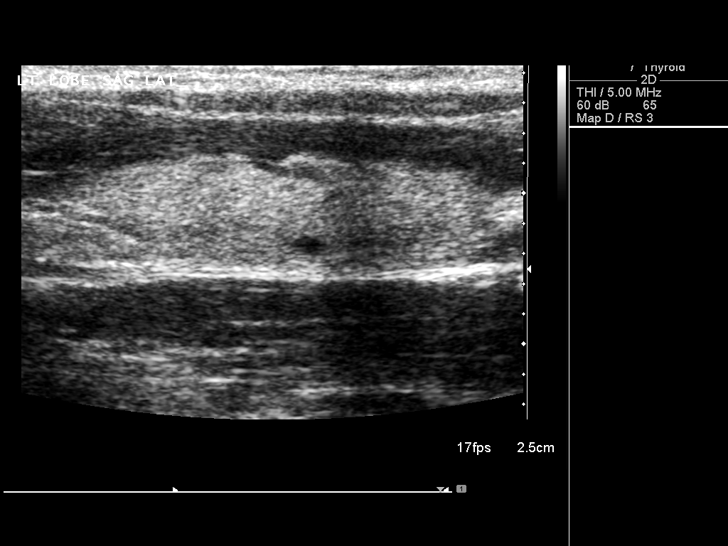
[im 30/40]
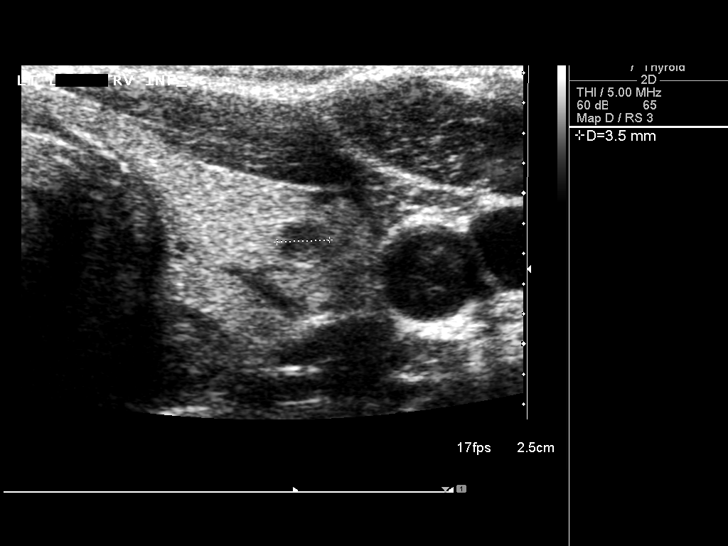
[im 33/40]
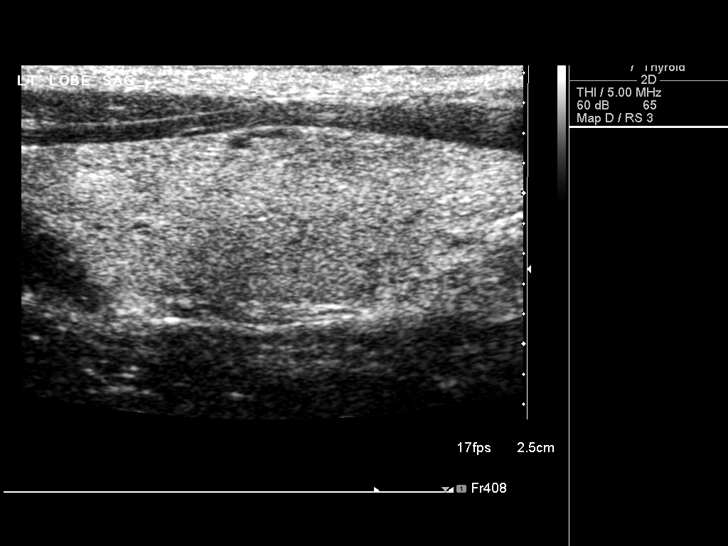
[im 36/40]
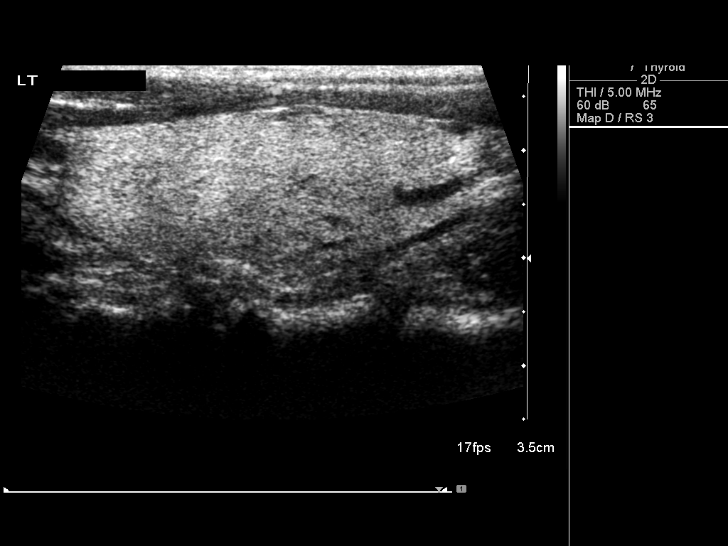
[im 40/40]
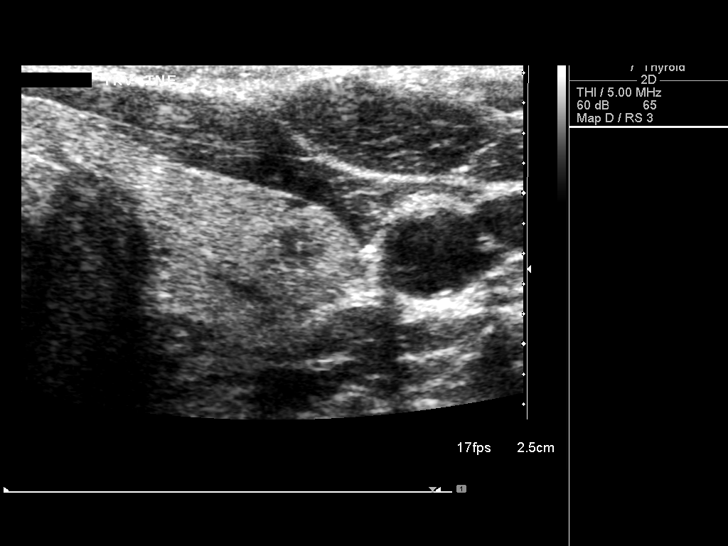

[14 of 25 positions shown; findings below may reference images not displayed]

FINDINGS: Right thyroid lobe

Measurements: Normal in size measuring 4.5 x 1.7 x 1.8 cm.

Right, mid, anterior, lateral - 0.6 x 0.5 x 0.6 cm - hypoechoic,
solid, ill-defined, potentially pseudo nodule.

Left thyroid lobe

Measurements: Normal in size measuring 3.9 x 1.7 x 1.7 cm.

Left, inferior - 0.4 x 0.3 x 0.4 cm - hypoechoic, solid

Isthmus

Thickness: Normal in size measuring 0.3 cm in diameter.

No discrete nodule or mass is identified within the thyroid isthmus.

Lymphadenopathy

None visualized.
IMPRESSION: Solitary bilateral punctate (sub 6 mm) thyroid nodules, neither of
which currently meet imaging criteria to recommend percutaneous
sampling.

This recommendation follows the consensus statement: Management of
Thyroid Nodules Detected at US: Society of Radiologists in
[DATE]. There

## 2017-06-04 ENCOUNTER — Encounter: Payer: Self-pay | Admitting: Family Medicine

## 2019-03-24 ENCOUNTER — Telehealth: Payer: Self-pay

## 2019-03-24 NOTE — Telephone Encounter (Signed)
[employer] disability papers put in box to be filled out.

## 2019-10-14 ENCOUNTER — Other Ambulatory Visit: Payer: Commercial Managed Care - PPO

## 2019-12-23 ENCOUNTER — Ambulatory Visit: Payer: Commercial Managed Care - PPO

## 2020-11-16 DIAGNOSIS — H524 Presbyopia: Secondary | ICD-10-CM | POA: Diagnosis not present

## 2020-11-16 DIAGNOSIS — H2513 Age-related nuclear cataract, bilateral: Secondary | ICD-10-CM | POA: Diagnosis not present

## 2020-11-16 DIAGNOSIS — H5203 Hypermetropia, bilateral: Secondary | ICD-10-CM | POA: Diagnosis not present

## 2020-11-16 DIAGNOSIS — H1045 Other chronic allergic conjunctivitis: Secondary | ICD-10-CM | POA: Diagnosis not present

## 2021-01-24 DIAGNOSIS — Z1231 Encounter for screening mammogram for malignant neoplasm of breast: Secondary | ICD-10-CM | POA: Diagnosis not present

## 2021-01-24 DIAGNOSIS — Z01419 Encounter for gynecological examination (general) (routine) without abnormal findings: Secondary | ICD-10-CM | POA: Diagnosis not present

## 2021-01-24 DIAGNOSIS — Z124 Encounter for screening for malignant neoplasm of cervix: Secondary | ICD-10-CM | POA: Diagnosis not present

## 2021-04-26 DIAGNOSIS — Z0000 Encounter for general adult medical examination without abnormal findings: Secondary | ICD-10-CM | POA: Diagnosis not present

## 2021-04-26 DIAGNOSIS — Z1329 Encounter for screening for other suspected endocrine disorder: Secondary | ICD-10-CM | POA: Diagnosis not present

## 2021-04-26 DIAGNOSIS — Z13220 Encounter for screening for lipoid disorders: Secondary | ICD-10-CM | POA: Diagnosis not present

## 2021-04-30 DIAGNOSIS — Z803 Family history of malignant neoplasm of breast: Secondary | ICD-10-CM | POA: Diagnosis not present

## 2021-05-09 ENCOUNTER — Encounter: Payer: Self-pay | Admitting: Gastroenterology

## 2021-06-03 ENCOUNTER — Ambulatory Visit (AMBULATORY_SURGERY_CENTER): Payer: Commercial Managed Care - PPO

## 2021-06-03 VITALS — Ht 66.0 in | Wt 140.0 lb

## 2021-06-03 DIAGNOSIS — Z1211 Encounter for screening for malignant neoplasm of colon: Secondary | ICD-10-CM

## 2021-06-03 MED ORDER — PLENVU 140 G PO SOLR: 1.0000 | Freq: Once | ORAL | 0 refills | Status: AC

## 2021-06-03 NOTE — Progress Notes (Signed)
No egg or soy allergy known to patient  No issues known to pt with past sedation with any surgeries or procedures Patient denies ever being told they had issues or difficulty with intubation  No FH of Malignant Hyperthermia Pt is not on diet pills Pt is not on  home 02  Pt is not on blood thinners  Pt denies issues with constipation  No A fib or A flutter  EMMI video to pt or via [place] 19 guidelines implemented in PV today with Pt and RN    Virtual previsit    Plenvu  Coupon given to pt in PV today , Code to Pharmacy and  NO PA's for preps discussed with pt In PV today  Discussed with pt there will be an out-of-pocket cost for prep and that varies from $0 to 70 +  dollars   Due to the COVID-19 pandemic we are asking patients to follow certain guidelines.  Pt aware of COVID protocols and [facility] guidelines

## 2021-06-05 ENCOUNTER — Encounter: Payer: Self-pay | Admitting: Gastroenterology

## 2021-06-17 ENCOUNTER — Other Ambulatory Visit: Payer: Self-pay

## 2021-06-17 ENCOUNTER — Encounter: Payer: Self-pay | Admitting: Gastroenterology

## 2021-06-17 ENCOUNTER — Ambulatory Visit (AMBULATORY_SURGERY_CENTER): Payer: BC Managed Care – PPO | Admitting: Gastroenterology

## 2021-06-17 VITALS — BP 100/70 | HR 67 | Temp 97.5°F | Resp 20 | Ht 66.0 in | Wt 140.0 lb

## 2021-06-17 DIAGNOSIS — Z1211 Encounter for screening for malignant neoplasm of colon: Secondary | ICD-10-CM | POA: Diagnosis not present

## 2021-06-17 DIAGNOSIS — D122 Benign neoplasm of ascending colon: Secondary | ICD-10-CM

## 2021-06-17 DIAGNOSIS — D125 Benign neoplasm of sigmoid colon: Secondary | ICD-10-CM

## 2021-06-17 DIAGNOSIS — K635 Polyp of colon: Secondary | ICD-10-CM

## 2021-06-17 MED ORDER — SODIUM CHLORIDE 0.9 % IV SOLN: 500.0000 mL | Freq: Once | INTRAVENOUS | Status: DC

## 2021-06-17 NOTE — Progress Notes (Signed)
Called to room to assist during endoscopic procedure.  Patient ID and intended procedure confirmed with present staff. Received instructions for my participation in the procedure from the performing physician.  

## 2021-06-17 NOTE — Progress Notes (Signed)
Sedate, gd SR, tolerated procedure well, VSS, report to RN 

## 2021-06-17 NOTE — Progress Notes (Signed)
   Referring Provider: Laurey Morale, MD Primary Care Physician:  Laurey Morale, MD  Reason for Procedure:  Colon cancer screening   IMPRESSION:  Need for colon cancer screening Appropriate candidate for monitored anesthesia care  PLAN: Colonoscopy in the [facility] today   HPI: Alicia Campos is a 45 y.o. female presents for screening colonoscopy.  No prior colonoscopy or colon cancer screening.  No baseline GI symptoms.   No known family history of colon cancer or polyps. No family history of uterine/endometrial cancer, pancreatic cancer or gastric/stomach cancer.   Past Medical History:  Diagnosis Date   Allergy    pollen   Anemia    Visit for gynecologic examination    sees Dr. Servando Salina     Past Surgical History:  Procedure Laterality Date   ENDOMETRIAL ABLATION W/ NOVASURE  09/16/2007   per Dr. Garwin Brothers    LASER ABLATION OF THE CERVIX     MOUTH SURGERY      Current Outpatient Medications  Medication Sig Dispense Refill   Lactobacillus (PROBIOTIC ACIDOPHILUS PO) Take by mouth daily with breakfast.     Multiple Vitamin (MULTIVITAMIN WITH MINERALS) TABS tablet Take 1 tablet by mouth daily.     Clindamycin-Benzoyl Per, Refr, gel APPLY TOPICALLY TWICE DAILY AS NEEDED. 45 g 1   FLUoxetine (PROZAC) 20 MG capsule Take 1 capsule by mouth daily.     ibuprofen (ADVIL,MOTRIN) 400 MG tablet Take 1 tablet (400 mg total) by mouth every 6 (six) hours as needed. 30 tablet 0   Current Facility-Administered Medications  Medication Dose Route Frequency Provider Last Rate Last Admin   0.9 %  sodium chloride infusion  500 mL Intravenous Once Thornton Park, MD        Allergies as of 06/17/2021 - Review Complete 06/17/2021  Allergen Reaction Noted   Codeine  07/02/2007    Family History  Problem Relation Age of Onset   Colon cancer Neg Hx    Colon polyps Neg Hx    Esophageal cancer Neg Hx    Rectal cancer Neg Hx    Stomach cancer Neg Hx      Physical  Exam: General:   Alert,  well-nourished, pleasant and cooperative in NAD Head:  Normocephalic and atraumatic. Eyes:  Sclera clear, no icterus.   Conjunctiva pink. Mouth:  No deformity or lesions.   Neck:  Supple; no masses or thyromegaly. Lungs:  Clear throughout to auscultation.   No wheezes. Heart:  Regular rate and rhythm; no murmurs. Abdomen:  Soft, non-tender, nondistended, normal bowel sounds, no rebound or guarding.  Msk:  Symmetrical. No boney deformities LAD: No inguinal or umbilical LAD Extremities:  No clubbing or edema. Neurologic:  Alert and  oriented x4;  grossly nonfocal Skin:  No obvious rash or bruise. Psych:  Alert and cooperative. Normal mood and affect.     Studies/Results: No results found.    Hydee Fleece L. Tarri Glenn, MD, MPH 06/17/2021, 10:28 AM

## 2021-06-17 NOTE — Progress Notes (Signed)
VS completed by DT.  Cell phone off per pt.  Pt's states no medical or surgical changes since previsit or office visit.

## 2021-06-17 NOTE — Op Note (Signed)
[facility] Patient Name: Alicia Campos Procedure Date: 06/17/2021 10:34 AM MRN: [id number] Endoscopist: Thornton Park MD, MD Age: 45 Referring MD:  Date of Birth: 1975/11/26 Gender: Female Account #: [id number] Procedure:                Colonoscopy Indications:              Screening for colorectal malignant neoplasm, This                            is the patient's first colonoscopy                           No known family history of colon cancer or polyps Medicines:                Monitored Anesthesia Care Procedure:                Pre-Anesthesia Assessment:                           - Prior to the procedure, a History and Physical                            was performed, and patient medications and                            allergies were reviewed. The patient's tolerance of                            previous anesthesia was also reviewed. The risks                            and benefits of the procedure and the sedation                            options and risks were discussed with the patient.                            All questions were answered, and informed consent                            was obtained. Prior Anticoagulants: The patient has                            taken no previous anticoagulant or antiplatelet                            agents. ASA Grade Assessment: II - A patient with                            mild systemic disease. After reviewing the risks                            and benefits, the patient was deemed in  satisfactory condition to undergo the procedure.                           After obtaining informed consent, the colonoscope                            was passed under direct vision. Throughout the                            procedure, the patient's blood pressure, pulse, and                            oxygen saturations were monitored continuously. The                            CF [id number] #[id number]  was introduced through the anus                            and advanced to the 3 cm into the ileum. A second                            forward view of the right colon was performed. The                            colonoscopy was performed without difficulty. The                            patient tolerated the procedure well. The quality                            of the bowel preparation was good. The terminal                            ileum, ileocecal valve, appendiceal orifice, and                            rectum were photographed. Scope In: 10:41:54 AM Scope Out: 10:55:14 AM Scope Withdrawal Time: 0 hours 10 minutes 46 seconds  Total Procedure Duration: 0 hours 13 minutes 20 seconds  Findings:                 The perianal and digital rectal examinations were                            normal.                           A 2 mm polyp was found in the sigmoid colon. The                            polyp was sessile. The polyp was removed with a                            cold snare. Resection and retrieval were  complete.                            Estimated blood loss was minimal.                           A 3 mm polyp was found in the ascending colon. The                            polyp was sessile. The polyp was removed with a                            cold snare. Resection and retrieval were complete.                            Estimated blood loss was minimal.                           The exam was otherwise without abnormality on                            direct and retroflexion views. Complications:            No immediate complications. Estimated blood loss:                            Minimal. Estimated Blood Loss:     Estimated blood loss was minimal. Impression:               - One 2 mm polyp in the sigmoid colon, removed with                            a cold snare. Resected and retrieved.                           - One 3 mm polyp in the ascending colon, removed                             with a cold snare. Resected and retrieved.                           - The examination was otherwise normal on direct                            and retroflexion views. Recommendation:           - Patient has a contact number available for                            emergencies. The signs and symptoms of potential                            delayed complications were discussed with the  patient. Return to normal activities tomorrow.                            Written discharge instructions were provided to the                            patient.                           - Resume previous diet.                           - Await pathology results.                           - Repeat colonoscopy date to be determined after                            pending pathology results are reviewed for                            surveillance.                           - Emerging evidence supports eating a diet of                            fruits, vegetables, grains, calcium, and yogurt                            while reducing red meat and alcohol may reduce the                            risk of colon cancer.                           - Thank you for allowing me to be involved in your                            colon cancer prevention. Thornton Park MD, MD 06/17/2021 11:00:43 AM This report has been signed electronically.

## 2021-06-17 NOTE — Patient Instructions (Signed)
Handout given for polyps.  YOU HAD AN ENDOSCOPIC PROCEDURE TODAY AT THE [facility]:   Refer to the procedure report that was given to you for any specific questions about what was found during the examination.  If the procedure report does not answer your questions, please call your gastroenterologist to clarify.  If you requested that your care partner not be given the details of your procedure findings, then the procedure report has been included in a sealed envelope for you to review at your convenience later.  YOU SHOULD EXPECT: Some feelings of bloating in the abdomen. Passage of more gas than usual.  Walking can help get rid of the air that was put into your GI tract during the procedure and reduce the bloating. If you had a lower endoscopy (such as a colonoscopy or flexible sigmoidoscopy) you may notice spotting of blood in your stool or on the toilet paper. If you underwent a bowel prep for your procedure, you may not have a normal bowel movement for a few days.  Please Note:  You might notice some irritation and congestion in your nose or some drainage.  This is from the oxygen used during your procedure.  There is no need for concern and it should clear up in a day or so.  SYMPTOMS TO REPORT IMMEDIATELY:   Following lower endoscopy (colonoscopy or flexible sigmoidoscopy):  Excessive amounts of blood in the stool  Significant tenderness or worsening of abdominal pains  Swelling of the abdomen that is new, acute  Fever of 100F or higher  For urgent or emergent issues, a gastroenterologist can be reached at any hour by calling [phone]. Do not use MyChart messaging for urgent concerns.    DIET:  We do recommend a small meal at first, but then you may proceed to your regular diet.  Drink plenty of fluids but you should avoid alcoholic beverages for 24 hours.  ACTIVITY:  You should plan to take it easy for the rest of today and you should NOT DRIVE or use heavy  machinery until tomorrow (because of the sedation medicines used during the test).    FOLLOW UP: Our staff will call the number listed on your records 48-72 hours following your procedure to check on you and address any questions or concerns that you may have regarding the information given to you following your procedure. If we do not reach you, we will leave a message.  We will attempt to reach you two times.  During this call, we will ask if you have developed any symptoms of COVID 19. If you develop any symptoms (ie: fever, flu-like symptoms, shortness of breath, cough etc.) before then, please call [phone].  If you test positive for Covid 19 in the 2 weeks post procedure, please call and report this information to us.    If any biopsies were taken you will be contacted by phone or by letter within the next 1-3 weeks.  Please call us at [phone] if you have not heard about the biopsies in 3 weeks.    SIGNATURES/CONFIDENTIALITY: You and/or your care partner have signed paperwork which will be entered into your electronic medical record.  These signatures attest to the fact that that the information above on your After Visit Summary has been reviewed and is understood.  Full responsibility of the confidentiality of this discharge information lies with you and/or your care-partner. 

## 2021-06-19 ENCOUNTER — Telehealth: Payer: Self-pay

## 2021-06-19 NOTE — Telephone Encounter (Signed)
No answer, left message to call back later today, B.Aundria Bitterman RN. 

## 2021-06-19 NOTE — Telephone Encounter (Signed)
  Follow up Call-  Call back number 06/17/2021  Post procedure Call Back phone  # [phone]  Permission to leave phone message Yes  Some recent data might be hidden     Patient questions:  Do you have a fever, pain , or abdominal swelling? No. Pain Score  0 *  Have you tolerated food without any problems? Yes.    Have you been able to return to your normal activities? Yes.    Do you have any questions about your discharge instructions: Diet   No. Medications  No. Follow up visit  No.  Do you have questions or concerns about your Care? No.  Actions: * If pain score is 4 or above: No action needed, pain <4. Have you developed a fever since your procedure? no  2.   Have you had an respiratory symptoms (SOB or cough) since your procedure? no  3.   Have you tested positive for COVID 19 since your procedure no  4.   Have you had any family members/close contacts diagnosed with the COVID 19 since your procedure?  no   If yes to any of these questions please route to Joylene John, RN and Joella Prince, RN

## 2021-06-23 ENCOUNTER — Encounter: Payer: Self-pay | Admitting: Gastroenterology

## 2021-08-27 ENCOUNTER — Encounter: Payer: Self-pay | Admitting: Family Medicine

## 2021-08-27 ENCOUNTER — Telehealth: Payer: BC Managed Care – PPO | Admitting: Family Medicine

## 2021-08-27 DIAGNOSIS — J019 Acute sinusitis, unspecified: Secondary | ICD-10-CM | POA: Diagnosis not present

## 2021-08-27 MED ORDER — AMOXICILLIN-POT CLAVULANATE 875-125 MG PO TABS: 1.0000 | ORAL_TABLET | Freq: Two times a day (BID) | ORAL | 0 refills | Status: DC

## 2021-08-27 MED ORDER — HYDROCODONE BIT-HOMATROP MBR 5-1.5 MG/5ML PO SOLN: 5.0000 mL | ORAL | 0 refills | Status: DC | PRN

## 2021-08-27 NOTE — Progress Notes (Signed)
° °  Subjective:    Patient ID: Alicia Campos, female    DOB: 1975/09/30, 45 y.o.   MRN: [id number]  HPI Virtual Visit via Video Note  I connected with the patient on 08/27/21 at  2:30 PM EST by a video enabled telemedicine application and verified that I am speaking with the correct person using two identifiers.  Location patient: home Location provider:work or home office Persons participating in the virtual visit: patient, provider  I discussed the limitations of evaluation and management by telemedicine and the availability of in person appointments. The patient expressed understanding and agreed to proceed.   HPI: Here for 4 weeks of sinus pressure, facial pain, PND, and a dry cough. No fever or ST. No body aches or NVD. Using OTC cold products.   ROS: See pertinent positives and negatives per HPI.  Past Medical History:  Diagnosis Date   Allergy    pollen   Anemia    Visit for gynecologic examination    sees Dr. Servando Salina     Past Surgical History:  Procedure Laterality Date   ENDOMETRIAL ABLATION W/ NOVASURE  09/16/2007   per Dr. Garwin Brothers    LASER ABLATION OF THE CERVIX     MOUTH SURGERY      Family History  Problem Relation Age of Onset   Colon cancer Neg Hx    Colon polyps Neg Hx    Esophageal cancer Neg Hx    Rectal cancer Neg Hx    Stomach cancer Neg Hx      Current Outpatient Medications:    amoxicillin-clavulanate (AUGMENTIN) 875-125 MG tablet, Take 1 tablet by mouth 2 (two) times daily., Disp: 20 tablet, Rfl: 0   HYDROcodone bit-homatropine (HYCODAN) 5-1.5 MG/5ML syrup, Take 5 mLs by mouth every 4 (four) hours as needed for cough., Disp: 240 mL, Rfl: 0   ibuprofen (ADVIL,MOTRIN) 400 MG tablet, Take 1 tablet (400 mg total) by mouth every 6 (six) hours as needed., Disp: 30 tablet, Rfl: 0   Lactobacillus (PROBIOTIC ACIDOPHILUS PO), Take by mouth daily with breakfast., Disp: , Rfl:    Multiple Vitamin (MULTIVITAMIN WITH MINERALS) TABS tablet, Take  1 tablet by mouth daily., Disp: , Rfl:   EXAM:  VITALS per patient if applicable:  GENERAL: alert, oriented, appears well and in no acute distress  HEENT: atraumatic, conjunttiva clear, no obvious abnormalities on inspection of external nose and ears  NECK: normal movements of the head and neck  LUNGS: on inspection no signs of respiratory distress, breathing rate appears normal, no obvious gross SOB, gasping or wheezing  CV: no obvious cyanosis  MS: moves all visible extremities without noticeable abnormality  PSYCH/NEURO: pleasant and cooperative, no obvious depression or anxiety, speech and thought processing grossly intact  ASSESSMENT AND PLAN: Sinusitis, treat with Augmentin. Recheck as needed.  Alysia Penna, MD  Discussed the following assessment and plan:  No diagnosis found.     I discussed the assessment and treatment plan with the patient. The patient was provided an opportunity to ask questions and all were answered. The patient agreed with the plan and demonstrated an understanding of the instructions.   The patient was advised to call back or seek an in-person evaluation if the symptoms worsen or if the condition fails to improve as anticipated.      Review of Systems     Objective:   Physical Exam        Assessment & Plan:

## 2021-08-28 ENCOUNTER — Telehealth: Payer: Self-pay

## 2021-08-28 MED ORDER — BENZONATATE 200 MG PO CAPS: 200.0000 mg | ORAL_CAPSULE | Freq: Four times a day (QID) | ORAL | 1 refills | Status: DC | PRN

## 2021-08-28 NOTE — Telephone Encounter (Signed)
I sent in Benzonatate pills

## 2021-08-28 NOTE — Telephone Encounter (Signed)
Patient called asking if another Rx can be sent to the pharmacy because to Codeine in the Rx she is taking upsets her stomach  HYDROcodone bit-homatropine (HYCODAN) 5-1.5 MG/5ML syrup

## 2021-08-28 NOTE — Telephone Encounter (Signed)
Sent a message to pt through [facility]

## 2021-08-28 NOTE — Addendum Note (Signed)
Addended by: Alysia Penna A on: 08/28/2021 04:57 PM   Modules accepted: Orders

## 2021-10-31 ENCOUNTER — Encounter: Payer: Self-pay | Admitting: Family Medicine

## 2021-10-31 ENCOUNTER — Telehealth (INDEPENDENT_AMBULATORY_CARE_PROVIDER_SITE_OTHER): Payer: BC Managed Care – PPO | Admitting: Family Medicine

## 2021-10-31 DIAGNOSIS — L700 Acne vulgaris: Secondary | ICD-10-CM | POA: Diagnosis not present

## 2021-10-31 MED ORDER — SPIRONOLACTONE 25 MG PO TABS: 25.0000 mg | ORAL_TABLET | Freq: Every day | ORAL | 3 refills | Status: DC

## 2021-10-31 NOTE — Progress Notes (Signed)
° °  Subjective:    Patient ID: Alicia Campos, female    DOB: [birth year], 46 y.o.   MRN: [id number]  HPI Virtual Visit via Video Note  I connected with the patient on 10/31/21 at 10:45 AM EST by a video enabled telemedicine application and verified that I am speaking with the correct person using two identifiers.  Location patient: home Location provider:work or home office Persons participating in the virtual visit: patient, provider  I discussed the limitations of evaluation and management by telemedicine and the availability of in person appointments. The patient expressed understanding and agreed to proceed.   HPI: Here asking for a refill  on Spironolactone. Her dermatologist gave this to her several years ago for acne, and it works very well for her. She takes it for a month or two whenever she has a flare up.    ROS: See pertinent positives and negatives per HPI.  Past Medical History:  Diagnosis Date   Allergy    pollen   Anemia    Visit for gynecologic examination    sees Dr. Servando Salina     Past Surgical History:  Procedure Laterality Date   ENDOMETRIAL ABLATION W/ NOVASURE  09/16/2007   per Dr. Garwin Brothers    LASER ABLATION OF THE CERVIX     MOUTH SURGERY      Family History  Problem Relation Age of Onset   Colon cancer Neg Hx    Colon polyps Neg Hx    Esophageal cancer Neg Hx    Rectal cancer Neg Hx    Stomach cancer Neg Hx      Current Outpatient Medications:    Lactobacillus (PROBIOTIC ACIDOPHILUS PO), Take by mouth daily with breakfast., Disp: , Rfl:    Multiple Vitamin (MULTIVITAMIN WITH MINERALS) TABS tablet, Take 1 tablet by mouth daily., Disp: , Rfl:    ibuprofen (ADVIL,MOTRIN) 400 MG tablet, Take 1 tablet (400 mg total) by mouth every 6 (six) hours as needed., Disp: 30 tablet, Rfl: 0   spironolactone (ALDACTONE) 25 MG tablet, Take 1 tablet (25 mg total) by mouth daily., Disp: 90 tablet, Rfl: 3  EXAM:  VITALS per patient if  applicable:  GENERAL: alert, oriented, appears well and in no acute distress  HEENT: atraumatic, conjunttiva clear, no obvious abnormalities on inspection of external nose and ears  NECK: normal movements of the head and neck  LUNGS: on inspection no signs of respiratory distress, breathing rate appears normal, no obvious gross SOB, gasping or wheezing  CV: no obvious cyanosis  MS: moves all visible extremities without noticeable abnormality  PSYCH/NEURO: pleasant and cooperative, no obvious depression or anxiety, speech and thought processing grossly intact  ASSESSMENT AND PLAN: Acne vulgaris, we wrote for Spironolactone 25 mg daily as needed. Alysia Penna, MD  Discussed the following assessment and plan:  No diagnosis found.     I discussed the assessment and treatment plan with the patient. The patient was provided an opportunity to ask questions and all were answered. The patient agreed with the plan and demonstrated an understanding of the instructions.   The patient was advised to call back or seek an in-person evaluation if the symptoms worsen or if the condition fails to improve as anticipated.      Review of Systems     Objective:   Physical Exam        Assessment & Plan:

## 2021-12-31 ENCOUNTER — Telehealth: Payer: Self-pay | Admitting: Family Medicine

## 2021-12-31 NOTE — Telephone Encounter (Signed)
Spironolactone '25mg'$  was sent to pharmacy on 10/31/21 for 1 year supply.  ?

## 2021-12-31 NOTE — Telephone Encounter (Signed)
Pt call and need a refill on spironolactone (ALDACTONE) 25 MG tablet pt stated she have a refill at the pharmacy but it need to be change to 50 mg and not 25 mg.sent to  ?[place] [place] [address] Phone:  [phone]  ?Fax:  [phone]  ?  ? ?

## 2022-01-06 MED ORDER — SPIRONOLACTONE 50 MG PO TABS: 50.0000 mg | ORAL_TABLET | Freq: Every day | ORAL | 3 refills | Status: DC

## 2022-01-06 NOTE — Telephone Encounter (Signed)
Lvm for patient, refill for Spironolactone '50mg'$  sent to [facility] ?

## 2022-01-06 NOTE — Telephone Encounter (Signed)
Done

## 2022-03-13 DIAGNOSIS — Z713 Dietary counseling and surveillance: Secondary | ICD-10-CM | POA: Diagnosis not present

## 2022-03-25 ENCOUNTER — Ambulatory Visit (INDEPENDENT_AMBULATORY_CARE_PROVIDER_SITE_OTHER): Payer: Self-pay

## 2022-03-25 NOTE — Progress Notes (Unsigned)
Blood draw for food sensitivity. TM

## 2022-04-03 DIAGNOSIS — Z713 Dietary counseling and surveillance: Secondary | ICD-10-CM | POA: Diagnosis not present

## 2022-04-14 DIAGNOSIS — R61 Generalized hyperhidrosis: Secondary | ICD-10-CM | POA: Diagnosis not present

## 2022-04-14 DIAGNOSIS — Z6824 Body mass index (BMI) 24.0-24.9, adult: Secondary | ICD-10-CM | POA: Diagnosis not present

## 2022-04-14 DIAGNOSIS — Z1231 Encounter for screening mammogram for malignant neoplasm of breast: Secondary | ICD-10-CM | POA: Diagnosis not present

## 2022-04-14 DIAGNOSIS — Z01419 Encounter for gynecological examination (general) (routine) without abnormal findings: Secondary | ICD-10-CM | POA: Diagnosis not present

## 2022-06-06 DIAGNOSIS — Z13220 Encounter for screening for lipoid disorders: Secondary | ICD-10-CM | POA: Diagnosis not present

## 2022-06-06 DIAGNOSIS — Z131 Encounter for screening for diabetes mellitus: Secondary | ICD-10-CM | POA: Diagnosis not present

## 2022-06-06 DIAGNOSIS — Z0000 Encounter for general adult medical examination without abnormal findings: Secondary | ICD-10-CM | POA: Diagnosis not present

## 2022-06-06 DIAGNOSIS — N958 Other specified menopausal and perimenopausal disorders: Secondary | ICD-10-CM | POA: Diagnosis not present

## 2022-07-22 DIAGNOSIS — L700 Acne vulgaris: Secondary | ICD-10-CM | POA: Diagnosis not present

## 2022-07-22 DIAGNOSIS — I788 Other diseases of capillaries: Secondary | ICD-10-CM | POA: Diagnosis not present

## 2022-07-22 DIAGNOSIS — D1801 Hemangioma of skin and subcutaneous tissue: Secondary | ICD-10-CM | POA: Diagnosis not present

## 2022-07-22 DIAGNOSIS — D2262 Melanocytic nevi of left upper limb, including shoulder: Secondary | ICD-10-CM | POA: Diagnosis not present

## 2022-07-22 DIAGNOSIS — D2261 Melanocytic nevi of right upper limb, including shoulder: Secondary | ICD-10-CM | POA: Diagnosis not present

## 2023-02-25 DIAGNOSIS — M25852 Other specified joint disorders, left hip: Secondary | ICD-10-CM | POA: Diagnosis not present

## 2023-03-09 ENCOUNTER — Other Ambulatory Visit: Payer: Self-pay | Admitting: Family Medicine

## 2023-04-27 ENCOUNTER — Other Ambulatory Visit: Payer: Self-pay | Admitting: Family Medicine

## 2023-04-29 ENCOUNTER — Telehealth: Payer: Self-pay | Admitting: Family Medicine

## 2023-04-29 ENCOUNTER — Other Ambulatory Visit: Payer: Self-pay

## 2023-04-29 MED ORDER — SPIRONOLACTONE 50 MG PO TABS: 50.0000 mg | ORAL_TABLET | Freq: Every day | ORAL | 0 refills | Status: DC

## 2023-04-29 NOTE — Telephone Encounter (Signed)
Prescription Request  04/29/2023  LOV: 10/31/21  What is the name of the medication or equipment?  spironolactone spironolactone (ALDACTONE) 50 MG tablet  Have you contacted your pharmacy to request a refill? Yes   Which pharmacy would you like this sent to?   [facility] [place], [place] - [address] [place] [address] Phone: [phone] Fax: [phone]    Patient notified that their request is being sent to the clinical staff for review and that they should receive a response within 2 business days.   Please advise at Mobile [phone] (mobile)

## 2023-04-29 NOTE — Telephone Encounter (Signed)
Rx sent 

## 2023-06-02 ENCOUNTER — Telehealth (INDEPENDENT_AMBULATORY_CARE_PROVIDER_SITE_OTHER): Payer: BC Managed Care – PPO | Admitting: Family Medicine

## 2023-06-02 ENCOUNTER — Encounter: Payer: Self-pay | Admitting: Family Medicine

## 2023-06-02 DIAGNOSIS — L700 Acne vulgaris: Secondary | ICD-10-CM | POA: Diagnosis not present

## 2023-06-02 MED ORDER — SPIRONOLACTONE 50 MG PO TABS: 50.0000 mg | ORAL_TABLET | Freq: Every day | ORAL | 3 refills | Status: DC

## 2023-06-02 NOTE — Progress Notes (Signed)
Subjective:    Patient ID: Alicia Campos, female    DOB: [birth year], 47 y.o.   MRN: [id number]  HPI Virtual Visit via Video Note  I connected with the patient on 06/02/23 at  1:45 PM EDT by a video enabled telemedicine application and verified that I am speaking with the correct person using two identifiers.  Location patient: home Location provider:work or home office Persons participating in the virtual visit: patient, provider  I discussed the limitations of evaluation and management by telemedicine and the availability of in person appointments. The patient expressed understanding and agreed to proceed.   HPI: Here for refills of Spironolactone. She has used this for years to control her acne, and it still works well for her. She saw her dermatologist, Dr. Jim Like, this past summer, and she told Tawyna to stay on it.    ROS: See pertinent positives and negatives per HPI.  Past Medical History:  Diagnosis Date   Allergy    pollen   Anemia    Visit for gynecologic examination    sees Dr. Maxie Better     Past Surgical History:  Procedure Laterality Date   ENDOMETRIAL ABLATION W/ NOVASURE  09/16/2007   per Dr. Cherly Hensen    LASER ABLATION OF THE CERVIX     MOUTH SURGERY      Family History  Problem Relation Age of Onset   Colon cancer Neg Hx    Colon polyps Neg Hx    Esophageal cancer Neg Hx    Rectal cancer Neg Hx    Stomach cancer Neg Hx      Current Outpatient Medications:    Lactobacillus (PROBIOTIC ACIDOPHILUS PO), Take by mouth daily with breakfast., Disp: , Rfl:    Multiple Vitamin (MULTIVITAMIN WITH MINERALS) TABS tablet, Take 1 tablet by mouth daily., Disp: , Rfl:    spironolactone (ALDACTONE) 50 MG tablet, Take 1 tablet (50 mg total) by mouth daily., Disp: 30 tablet, Rfl: 0  EXAM:  VITALS per patient if applicable:  GENERAL: alert, oriented, appears well and in no acute distress  HEENT: atraumatic, conjunttiva clear, no obvious  abnormalities on inspection of external nose and ears  NECK: normal movements of the head and neck  LUNGS: on inspection no signs of respiratory distress, breathing rate appears normal, no obvious gross SOB, gasping or wheezing  CV: no obvious cyanosis  MS: moves all visible extremities without noticeable abnormality  PSYCH/NEURO: pleasant and cooperative, no obvious depression or anxiety, speech and thought processing grossly intact  ASSESSMENT AND PLAN: Ance vulgaris, well controlled. We refilled the Spironolactone to take 50 mg daily.  Gershon Crane, MD  Discussed the following assessment and plan:  No diagnosis found.     I discussed the assessment and treatment plan with the patient. The patient was provided an opportunity to ask questions and all were answered. The patient agreed with the plan and demonstrated an understanding of the instructions.   The patient was advised to call back or seek an in-person evaluation if the symptoms worsen or if the condition fails to improve as anticipated.      Review of Systems     Objective:   Physical Exam        Assessment & Plan:

## 2023-06-26 DIAGNOSIS — M25552 Pain in left hip: Secondary | ICD-10-CM | POA: Diagnosis not present

## 2023-06-26 DIAGNOSIS — M5416 Radiculopathy, lumbar region: Secondary | ICD-10-CM | POA: Diagnosis not present

## 2023-10-27 ENCOUNTER — Telehealth: Payer: BC Managed Care – PPO | Admitting: Physician Assistant

## 2023-10-27 DIAGNOSIS — Z20828 Contact with and (suspected) exposure to other viral communicable diseases: Secondary | ICD-10-CM | POA: Diagnosis not present

## 2023-10-27 DIAGNOSIS — R6889 Other general symptoms and signs: Secondary | ICD-10-CM | POA: Diagnosis not present

## 2023-10-27 MED ORDER — OSELTAMIVIR PHOSPHATE 75 MG PO CAPS: 75.0000 mg | ORAL_CAPSULE | Freq: Two times a day (BID) | ORAL | 0 refills | Status: AC

## 2023-10-27 NOTE — Progress Notes (Signed)
new e-Visit to address those concerns.  You will get an e-mail in the next two days asking about your experience.  I hope that your e-visit has been valuable and will speed your recovery. Thank you for using e-visits.

## 2023-10-27 NOTE — Progress Notes (Signed)
I have spent 5 minutes in review of e-visit questionnaire, review and updating patient chart, medical decision making and response to patient.   Piedad Climes, PA-C

## 2024-04-27 DIAGNOSIS — M25552 Pain in left hip: Secondary | ICD-10-CM | POA: Diagnosis not present

## 2024-06-02 ENCOUNTER — Other Ambulatory Visit: Payer: Self-pay | Admitting: Family Medicine

## 2024-07-05 DIAGNOSIS — F4322 Adjustment disorder with anxiety: Secondary | ICD-10-CM | POA: Diagnosis not present

## 2024-07-19 DIAGNOSIS — F4322 Adjustment disorder with anxiety: Secondary | ICD-10-CM | POA: Diagnosis not present

## 2024-07-25 DIAGNOSIS — Z01419 Encounter for gynecological examination (general) (routine) without abnormal findings: Secondary | ICD-10-CM | POA: Diagnosis not present

## 2024-07-25 DIAGNOSIS — Z6823 Body mass index (BMI) 23.0-23.9, adult: Secondary | ICD-10-CM | POA: Diagnosis not present

## 2024-07-25 DIAGNOSIS — Z124 Encounter for screening for malignant neoplasm of cervix: Secondary | ICD-10-CM | POA: Diagnosis not present

## 2024-08-01 DIAGNOSIS — Z1231 Encounter for screening mammogram for malignant neoplasm of breast: Secondary | ICD-10-CM | POA: Diagnosis not present

## 2024-08-31 ENCOUNTER — Other Ambulatory Visit: Payer: Self-pay | Admitting: Family Medicine
